# Patient Record
Sex: Female | Born: 1995 | Hispanic: Yes | Marital: Married | Smoking: Current some day smoker
Health system: Southern US, Community
[De-identification: ages and names within clinical notes are randomized; demographics above are authoritative.]

## PROBLEM LIST (undated history)

## (undated) DIAGNOSIS — F419 Anxiety disorder, unspecified: Secondary | ICD-10-CM

## (undated) DIAGNOSIS — F32A Depression, unspecified: Secondary | ICD-10-CM

## (undated) DIAGNOSIS — M549 Dorsalgia, unspecified: Secondary | ICD-10-CM

## (undated) DIAGNOSIS — M419 Scoliosis, unspecified: Secondary | ICD-10-CM

## (undated) DIAGNOSIS — F329 Major depressive disorder, single episode, unspecified: Secondary | ICD-10-CM

## (undated) DIAGNOSIS — Z9889 Other specified postprocedural states: Secondary | ICD-10-CM

## (undated) DIAGNOSIS — E282 Polycystic ovarian syndrome: Secondary | ICD-10-CM

## (undated) DIAGNOSIS — R112 Nausea with vomiting, unspecified: Secondary | ICD-10-CM

## (undated) DIAGNOSIS — O24419 Gestational diabetes mellitus in pregnancy, unspecified control: Secondary | ICD-10-CM

## (undated) HISTORY — DX: Gestational diabetes mellitus in pregnancy, unspecified control: O24.419

## (undated) HISTORY — DX: Nausea with vomiting, unspecified: R11.2

## (undated) HISTORY — DX: Other specified postprocedural states: Z98.890

## (undated) HISTORY — PX: BREAST CYST EXCISION: SHX579

## (undated) HISTORY — PX: WISDOM TOOTH EXTRACTION: SHX21

---
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
## 2016-01-21 ENCOUNTER — Encounter (HOSPITAL_COMMUNITY): Payer: Self-pay | Admitting: Emergency Medicine

## 2016-01-21 ENCOUNTER — Emergency Department (HOSPITAL_COMMUNITY): Payer: Medicaid Other

## 2016-01-21 ENCOUNTER — Emergency Department (HOSPITAL_COMMUNITY)
Admission: EM | Admit: 2016-01-21 | Discharge: 2016-01-21 | Disposition: A | Discharge status: Home / Self Care | Payer: Medicaid Other | Attending: Emergency Medicine | Admitting: Emergency Medicine

## 2016-01-21 DIAGNOSIS — M419 Scoliosis, unspecified: Secondary | ICD-10-CM | POA: Diagnosis not present

## 2016-01-21 DIAGNOSIS — Z79899 Other long term (current) drug therapy: Secondary | ICD-10-CM | POA: Diagnosis not present

## 2016-01-21 DIAGNOSIS — M545 Low back pain: Secondary | ICD-10-CM | POA: Diagnosis present

## 2016-01-21 DIAGNOSIS — F329 Major depressive disorder, single episode, unspecified: Secondary | ICD-10-CM | POA: Diagnosis not present

## 2016-01-21 DIAGNOSIS — M549 Dorsalgia, unspecified: Secondary | ICD-10-CM

## 2016-01-21 HISTORY — DX: Scoliosis, unspecified: M41.9

## 2016-01-21 LAB — POC URINE PREG, ED: PREG TEST UR: NEGATIVE

## 2016-01-21 MED ORDER — NAPROXEN 500 MG PO TABS: 500.0000 mg | ORAL_TABLET | Freq: Two times a day (BID) | ORAL | Status: DC

## 2016-01-21 MED ORDER — METHOCARBAMOL 500 MG PO TABS: 500.0000 mg | ORAL_TABLET | Freq: Two times a day (BID) | ORAL | Status: DC

## 2016-01-21 MED ORDER — NAPROXEN 500 MG PO TABS
500.0000 mg | ORAL_TABLET | Freq: Once | ORAL | Status: AC
  Administered 2016-01-21: 500 mg via ORAL
  Filled 2016-01-21: qty 1

## 2016-01-21 MED ORDER — METHOCARBAMOL 500 MG PO TABS
500.0000 mg | ORAL_TABLET | Freq: Once | ORAL | Status: AC
  Administered 2016-01-21: 500 mg via ORAL
  Filled 2016-01-21: qty 1

## 2016-01-21 NOTE — ED Provider Notes (Signed)
CSN: [id number]     Arrival date & time 01/21/16  1643 History  By signing my name below, I, Octavia Heir, attest that this documentation has been prepared under the direction and in the presence of Sharilyn Sites, PA-C. Electronically Signed: Octavia Heir, ED Scribe. 01/21/2016. 5:33 PM.     Chief Complaint  Patient presents with  . Back Pain     The history is provided by the patient. No language interpreter was used.   HPI Comments: Candice Farrell is a 20 y.o. female who has a PMHx of scoliosis presents to the Emergency Department complaining of constant, sudden onset, gradual worsening, moderate non-radiating lower back pain onset today. Pt says she was sitting in class and started having extreme pain in her lower back pain. Pt is able to ambulate minimally due to increased back pain. She says she had a similar episode occur last year and was given muscle relaxers to alleviate her pain with minimal relief. She denies any trauma or injury to her back.  No numbness, paresthesias or weakness of extremities.  No loss of bowel or bladder control.  States she is followed by back specialist for her scoliosis, however has not seem him in over 1 year.  No hx of back surgeries.  VSS.  Past Medical History  Diagnosis Date  . Anxiety   . Depression    Past Surgical History  Procedure Laterality Date  . Breast cyst excision     No family history on file. Social History  Substance Use Topics  . Smoking status: Never Smoker   . Smokeless tobacco: Not on file  . Alcohol Use: No   OB History    No data available     Review of Systems  Musculoskeletal: Positive for back pain.  All other systems reviewed and are negative.     Allergies  Review of patient's allergies indicates no known allergies.  Home Medications   Prior to Admission medications   Medication Sig Start Date End Date Taking? Authorizing Provider  buPROPion (WELLBUTRIN XL) 300 MG 24 hr tablet Take 1 tablet (300 mg  total) by mouth daily. 04/01/14   Kendrick Fries, NP   Triage vitals: BP 135/78 mmHg  Pulse 85  Temp(Src) 98.2 F (36.8 C) (Oral)  Resp 18  SpO2 99%  LMP 01/14/2016 Physical Exam  Constitutional: She is oriented to person, place, and time. She appears well-developed and well-nourished.  HENT:  Head: Normocephalic and atraumatic.  Mouth/Throat: Oropharynx is clear and moist.  Eyes: Conjunctivae and EOM are normal. Pupils are equal, round, and reactive to light.  Neck: Normal range of motion.  Cardiovascular: Normal rate, regular rhythm and normal heart sounds.   Pulmonary/Chest: Effort normal and breath sounds normal.  Abdominal: Soft. Bowel sounds are normal.  Musculoskeletal: Normal range of motion.  Lumbar spine with midline tenderness, mild chronic lordosis noted, decreased ROM due to pain, normal strength/sensation of BLE, ambulatory with slow gait  Neurological: She is alert and oriented to person, place, and time.  Skin: Skin is warm and dry.  Psychiatric: She has a normal mood and affect.  Nursing note and vitals reviewed.   ED Course  Procedures  DIAGNOSTIC STUDIES: Oxygen Saturation is 99% on RA, normal by my interpretation.  COORDINATION OF CARE:  5:29 PM Will order x-ray of back. Discussed treatment plan which includes pain medication with pt at bedside and pt agreed to plan.  Labs Review Labs Reviewed  POC URINE PREG, ED    Imaging  Review Dg Lumbar Spine Complete  01/21/2016  CLINICAL DATA:  Sudden onset of back pain while sitting, history of scoliosis, no known injury EXAM: LUMBAR SPINE - COMPLETE 4+ VIEW COMPARISON:  None. FINDINGS: Five views of the lumbar spine submitted. No acute fracture or subluxation. Alignment, disc spaces and vertebral body heights are preserved. IMPRESSION: Negative. Electronically Signed   By: Natasha MeadLiviu  Pop M.D.   On: 01/21/2016 18:36   I have personally reviewed and evaluated these images and lab results as part of my medical  decision-making.   EKG Interpretation None      MDM   Final diagnoses:  Back pain, unspecified location   20 y.o. F here with sudden onset, atraumatic pain this afternoon while sitting in class.  Hx of scoliosis, similar pain in the past.  Patient has tenderness of her midline lumbar spine noted on exam. She appears to have a chronic mild lordosis. Normal strength and sensation of bilateral lower extremities, gait is slow but otherwise normal. No deficits to suggest cauda equina.  Denies any current urinary symptoms, fever, chills, or flank pain. X-ray of lumbar spine obtained, no acute findings. Patient was treated with Naprosyn and Robaxin in the ED with improvement of her pain. Will discharge home with the same.  Encouraged to follow-up with her back specialist if no improvement in the next week.  Discussed plan with patient, he/she acknowledged understanding and agreed with plan of care.  Return precautions given for new or worsening symptoms.  I personally performed the services described in this documentation, which was scribed in my presence. The recorded information has been reviewed and is accurate.  Garlon HatchetLisa M Akim Watkinson, PA-C 01/21/16 1923  Donnetta HutchingBrian Cook, MD 01/21/16 2250

## 2016-01-21 NOTE — Discharge Instructions (Signed)
Take the prescribed medication as directed. Follow-up with your back specialist if no improvement in the next week or so. Return to the ED for new or worsening symptoms.

## 2016-01-21 NOTE — ED Notes (Signed)
Pt was sitting in class this afternoon when she had a sudden onset of lower back pain. Same thing happened a year ago and was given rx for muscle relaxers. Hx of scoliosis.

## 2016-05-22 ENCOUNTER — Encounter: Payer: Self-pay | Admitting: *Deleted

## 2016-05-22 LAB — PROCEDURE REPORT - SCANNED: PAP SMEAR: NEGATIVE

## 2016-07-04 ENCOUNTER — Encounter (HOSPITAL_COMMUNITY): Payer: Self-pay | Admitting: Emergency Medicine

## 2016-07-04 DIAGNOSIS — G8929 Other chronic pain: Secondary | ICD-10-CM | POA: Diagnosis not present

## 2016-07-04 DIAGNOSIS — M545 Low back pain: Secondary | ICD-10-CM | POA: Insufficient documentation

## 2016-07-04 DIAGNOSIS — M25511 Pain in right shoulder: Secondary | ICD-10-CM | POA: Diagnosis not present

## 2016-07-04 NOTE — ED Triage Notes (Signed)
C/o R shoulder pain and lower back pain x 2-3 days that has been worse since last night.  Reports she injured them at work about a month ago and was told not to lift anything over 15 lbs.  She had been doing better and pain started back 2-3 days ago.

## 2016-07-05 ENCOUNTER — Emergency Department (HOSPITAL_COMMUNITY)
Admission: EM | Admit: 2016-07-05 | Discharge: 2016-07-05 | Disposition: A | Discharge status: Home / Self Care | Payer: Medicaid Other | Attending: Emergency Medicine | Admitting: Emergency Medicine

## 2016-07-05 DIAGNOSIS — M25511 Pain in right shoulder: Secondary | ICD-10-CM

## 2016-07-05 DIAGNOSIS — G8929 Other chronic pain: Secondary | ICD-10-CM

## 2016-07-05 DIAGNOSIS — M545 Low back pain: Secondary | ICD-10-CM

## 2016-07-05 HISTORY — DX: Dorsalgia, unspecified: M54.9

## 2016-07-05 MED ORDER — METHOCARBAMOL 500 MG PO TABS: 500.0000 mg | ORAL_TABLET | Freq: Every day | ORAL | 0 refills | Status: DC

## 2016-07-05 MED ORDER — NAPROXEN 500 MG PO TABS: 500.0000 mg | ORAL_TABLET | Freq: Two times a day (BID) | ORAL | 0 refills | Status: DC

## 2016-07-05 NOTE — ED Provider Notes (Signed)
[facility]-EMERGENCY DEPT Provider Note   CSN: [id number] Arrival date & time: 07/04/16  2323     History   Chief Complaint Chief Complaint  Patient presents with  . Back Pain  . Shoulder Pain    HPI Candice Farrell is a 20 y.o. female.  HPI   Patient is a 20 year old female with a history of chronic lower back pain, depression, scoliosis, who presents the emergency department with progressively worsening right rear shoulder pain for 3 days. Patient also states her chronic back pain is worsened over the last 2 days. Patient states 3 weeks ago work she injured her right shoulder. She states the pain she is having now is similar to the pain 3 weeks ago only worse. She says her pain is at 7/10 at rest, worse with movement, 9/10, nonradiating. 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 Patient has taken Robaxin and naproxen with little relief. Patient denies numbness/timing, weakness, fevers, urinary retention, loss of bowel bladder function, fevers.  Past Medical History:  Diagnosis Date  . Anxiety   . Back pain   . Depression   . Scoliosis     Patient Active Problem List   Diagnosis Date Noted  . MDD (major depressive disorder), recurrent episode, moderate (HCC) 03/25/2014  . ODD (oppositional defiant disorder) 03/25/2014  . Post traumatic stress disorder (PTSD) 03/25/2014  . Cannabis abuse 03/25/2014    Past Surgical History:  Procedure Laterality Date  . BREAST CYST EXCISION      OB History    No data available       Home Medications    Prior to Admission medications   Medication Sig Start Date End Date Taking? Authorizing Provider  buPROPion (WELLBUTRIN XL) 300 MG 24 hr tablet Take 1 tablet (300 mg total) by mouth daily. 04/01/14   Kendrick FriesMeghan Blankmann, NP  methocarbamol (ROBAXIN) 500 MG tablet Take 1 tablet (500 mg total) by mouth at bedtime. 07/05/16   Jerre SimonJessica L Ellieana Dolecki, PA  naproxen (NAPROSYN) 500 MG tablet Take 1 tablet (500 mg total) by mouth 2 (two) times daily with a  meal. 07/05/16   Jerre SimonJessica L Ethridge Sollenberger, PA    Family History No family history on file.  Social History Social History  Substance Use Topics  . Smoking status: Never Smoker  . Smokeless tobacco: Never Used  . Alcohol use No     Allergies   Review of patient's allergies indicates no known allergies.   Review of Systems Review of Systems  Constitutional: Negative for fever.  Gastrointestinal: Negative for nausea and vomiting.  Musculoskeletal: Positive for arthralgias and back pain. Negative for joint swelling, neck pain and neck stiffness.  Skin: Negative for rash and wound.  Neurological: Negative for weakness and numbness.     Physical Exam Updated Vital Signs BP 137/73 (BP Location: Left Arm)   Pulse 65   Temp 97.8 F (36.6 C) (Oral)   Resp 18   LMP 06/27/2016 (Exact Date)   SpO2 100%   Physical Exam  Constitutional: She appears well-developed and well-nourished. No distress.  HENT:  Head: Normocephalic and atraumatic.  Eyes: Conjunctivae are normal.  Neck: Normal range of motion. Neck supple.  Pulmonary/Chest: Effort normal. No respiratory distress.  Musculoskeletal: Normal range of motion.  Patient with full range of motion, sensation intact, strength 5/5 of bilateral upper extremities, TTP 2 supraspinatus, infraspinatus and trapezius muscles on the right side. Pain reproduced with resisted abduction. Pain also reproduced with liftoff test. 2+ radial pulses bilaterally. Patient with  mild TTP to spinous processes of the L-spine roughly L3-L4. Mild bilateral paraspinal muscle tenderness. Good range of motion of T and L spine. Normal gait  Neurological: She is alert. She has normal strength. No sensory deficit. Coordination normal.  Skin: Skin is warm and dry. She is not diaphoretic.  Psychiatric: She has a normal mood and affect. Her behavior is normal.  Nursing note and vitals reviewed.    ED Treatments / Results  Labs (all labs ordered are listed, but only  abnormal results are displayed) Labs Reviewed - No data to display  EKG  EKG Interpretation None       Radiology No results found.  Procedures Procedures (including critical care time)  Medications Ordered in ED Medications - No data to display   Initial Impression / Assessment and Plan / ED Course  I have reviewed the triage vital signs and the nursing notes.  Pertinent labs & imaging results that were available during my care of the patient were reviewed by me and considered in my medical decision making (see chart for details).  Clinical Course   Patient with unchanged chronic lower back pain. Patient with right shoulder pain from previous injury at job.   Exam findings are nonconcerning for any bony pathologies and suspect this is MSK in nature. I do not believe any imaging is indicated at this time due to pt's full ROM involving the shoulder, elbow, and wrist. Her presentation is concerning for rotator cuff muscle pathology.  Conservative therapies recommended at home including ice, pain medication, muscle relaxants. Patient is neurovascular intact distally. IInstructed patient to follow up with her primary care provider regarding her chronic back pain and shoulder pain. Discussed strict return precautions the ED. Patient afebrile, VSS, stable at time of discharge. Patient expressed understanding to the discharge instructions. All questions were answered prior to disposition.  Final Clinical Impressions(s) / ED Diagnoses   Final diagnoses:  Right shoulder pain, unspecified chronicity  Chronic midline low back pain without sciatica    New Prescriptions Current Discharge Medication List       Jerre Simon, [place] 07/06/16 0021    Dione Booze, MD 07/06/16 [id number]

## 2016-07-05 NOTE — Discharge Instructions (Signed)
Ice your shoulder and your lower back as often as you can, 20 minutes on, 20 minutes off. Take the naproxen and Flexeril as prescribed. Follow-up with your primary care physician this week regarding your back pain and shoulder pain. Return to the emergency department if you experience numbness/tingling or weakness in your right hand, loss of bowel bladder function, inability to urinate, pain radiating into your buttocks or legs or any other concerning symptoms.

## 2016-07-08 ENCOUNTER — Encounter (HOSPITAL_COMMUNITY): Payer: Self-pay

## 2016-07-08 ENCOUNTER — Emergency Department (HOSPITAL_COMMUNITY)
Admission: EM | Admit: 2016-07-08 | Discharge: 2016-07-08 | Disposition: A | Discharge status: Home / Self Care | Payer: Medicaid Other | Attending: Emergency Medicine | Admitting: Emergency Medicine

## 2016-07-08 DIAGNOSIS — N926 Irregular menstruation, unspecified: Secondary | ICD-10-CM | POA: Diagnosis not present

## 2016-07-08 DIAGNOSIS — N898 Other specified noninflammatory disorders of vagina: Secondary | ICD-10-CM | POA: Diagnosis present

## 2016-07-08 LAB — I-STAT BETA HCG BLOOD, ED (MC, WL, AP ONLY): I-stat hCG, quantitative: <5 m[IU]/mL (ref <5)

## 2016-07-08 LAB — POC URINE PREG, ED: Preg Test, Ur: NEGATIVE

## 2016-07-08 NOTE — Discharge Instructions (Signed)
Pregnancy test is negative. Please follow up with your doctor as needed.

## 2016-07-08 NOTE — ED Triage Notes (Signed)
Patient here requesting serum pregnancy test. Did urine preg at home and states line was a faint color. denis pain, no complaints.

## 2016-07-08 NOTE — ED Provider Notes (Signed)
[facility]-EMERGENCY DEPT Provider Note   CSN: [id number] Arrival date & time: 07/08/16  1435   By signing my name below, I, Freida Busman, attest that this documentation has been prepared under the direction and in the presence of non-physician practitioner, Jaynie Crumble, PA-C. Electronically Signed: Freida Busman, Scribe. 07/08/2016. 2:58 PM.   History   Chief Complaint Chief Complaint  Patient presents with  . Possible Pregnancy    The history is provided by the patient. No language interpreter was used.     HPI Comments:  Candice Farrell is a 20 y.o. female who presents to the Emergency Department to have a pregnancy test done. She states she took a pregnancy test at home and saw 2 lines but notes the second line was faint. Her LNMP was September 2017. She notes she stopped taking BCP ~5 months ago due to fibroid tumor. She states she has had an episode of brown vaginal discharge and mild spotting but states she has not had a heavy period like she normal has this month.  No abdominal pain or urinary symptoms.    Past Medical History:  Diagnosis Date  . Anxiety   . Back pain   . Depression   . Scoliosis     Patient Active Problem List   Diagnosis Date Noted  . MDD (major depressive disorder), recurrent episode, moderate (HCC) 03/25/2014  . ODD (oppositional defiant disorder) 03/25/2014  . Post traumatic stress disorder (PTSD) 03/25/2014  . Cannabis abuse 03/25/2014    Past Surgical History:  Procedure Laterality Date  . BREAST CYST EXCISION      OB History    No data available       Home Medications    Prior to Admission medications   Medication Sig Start Date End Date Taking? Authorizing Provider  buPROPion (WELLBUTRIN XL) 300 MG 24 hr tablet Take 1 tablet (300 mg total) by mouth daily. 04/01/14   Kendrick Fries, NP  methocarbamol (ROBAXIN) 500 MG tablet Take 1 tablet (500 mg total) by mouth at bedtime. 07/05/16   Jerre Simon, PA  naproxen (NAPROSYN)  500 MG tablet Take 1 tablet (500 mg total) by mouth 2 (two) times daily with a meal. 07/05/16   Jerre Simon, PA    Family History No family history on file.  Social History Social History  Substance Use Topics  . Smoking status: Never Smoker  . Smokeless tobacco: Never Used  . Alcohol use No     Allergies   Review of patient's allergies indicates no known allergies.   Review of Systems Review of Systems  Gastrointestinal: Negative for abdominal pain.  Genitourinary: Positive for vaginal discharge (resolved). Negative for dysuria, frequency, hematuria, urgency and vaginal bleeding.     Physical Exam Updated Vital Signs BP 143/87 (BP Location: Right Arm)   Pulse 77   Temp 98.1 F (36.7 C) (Oral)   Resp 16   Ht 4\' 11"  (1.499 m)   Wt 180 lb (81.6 kg)   LMP 05/28/2016   SpO2 100%   BMI 36.36 kg/m   Physical Exam  Constitutional: She is oriented to person, place, and time. She appears well-developed and well-nourished. No distress.  HENT:  Head: Normocephalic and atraumatic.  Eyes: Conjunctivae are normal.  Cardiovascular: Normal rate.   Pulmonary/Chest: Effort normal.  Abdominal: She exhibits no distension.  Neurological: She is alert and oriented to person, place, and time.  Skin: Skin is warm and dry.  Psychiatric: She has a normal mood and affect.  Nursing note and vitals reviewed.    ED Treatments / Results  DIAGNOSTIC STUDIES:  Oxygen Saturation is 100% on RA, normal by my interpretation.    COORDINATION OF CARE:  2:51 PM Discussed treatment plan with pt at bedside and pt agreed to plan.  Labs (all labs ordered are listed, but only abnormal results are displayed) Labs Reviewed  POC URINE PREG, ED    EKG  EKG Interpretation None       Radiology No results found.  Procedures Procedures (including critical care time)  Medications Ordered in ED Medications - No data to display   Initial Impression / Assessment and Plan / ED Course   I have reviewed the triage vital signs and the nursing notes.  Pertinent labs & imaging results that were available during my care of the patient were reviewed by me and considered in my medical decision making (see chart for details).  Clinical Course    Patient emergency department for pregnancy test only. She has no complaints. She had a positive test at home. Urine pregnancy obtained and was negative. We did run blood hCG which also came back negative. Discuss results with patient, will have patient follow-up with her doctor as needed.  Vitals:   07/08/16 1440  BP: 143/87  Pulse: 77  Resp: 16  Temp: 98.1 F (36.7 C)  TempSrc: Oral  SpO2: 100%  Weight: 81.6 kg  Height: 4\' 11"  (1.499 m)     Final Clinical Impressions(s) / ED Diagnoses   Final diagnoses:  Irregular periods    New Prescriptions New Prescriptions   No medications on file   I personally performed the services described in this documentation, which was scribed in my presence. The recorded information has been reviewed and is accurate.     Jaynie Crumbleatyana Fransico Sciandra, PA-C 07/08/16 1615    Alvira MondayErin Schlossman, MD 07/08/16 2351

## 2016-07-08 NOTE — ED Notes (Signed)
Papers reviewed and patient verbalizes understanding. Excited with the results

## 2017-08-28 ENCOUNTER — Encounter (HOSPITAL_COMMUNITY): Payer: Self-pay | Admitting: *Deleted

## 2017-08-28 ENCOUNTER — Other Ambulatory Visit: Payer: Self-pay

## 2017-08-28 ENCOUNTER — Emergency Department (HOSPITAL_COMMUNITY)
Admission: EM | Admit: 2017-08-28 | Discharge: 2017-08-28 | Disposition: A | Discharge status: Home / Self Care | Payer: Self-pay | Attending: Emergency Medicine | Admitting: Emergency Medicine

## 2017-08-28 DIAGNOSIS — Z79899 Other long term (current) drug therapy: Secondary | ICD-10-CM | POA: Insufficient documentation

## 2017-08-28 DIAGNOSIS — N939 Abnormal uterine and vaginal bleeding, unspecified: Secondary | ICD-10-CM | POA: Insufficient documentation

## 2017-08-28 DIAGNOSIS — R102 Pelvic and perineal pain: Secondary | ICD-10-CM

## 2017-08-28 DIAGNOSIS — N852 Hypertrophy of uterus: Secondary | ICD-10-CM | POA: Insufficient documentation

## 2017-08-28 LAB — COMPREHENSIVE METABOLIC PANEL
ALT: 28 U/L (ref 14–54)
AST: 25 U/L (ref 15–41)
Albumin: 4.7 g/dL (ref 3.5–5.0)
Alkaline Phosphatase: 62 U/L (ref 38–126)
Anion gap: 9 (ref 5–15)
BUN: 7 mg/dL (ref 6–20)
CHLORIDE: 103 mmol/L (ref 101–111)
CO2: 23 mmol/L (ref 22–32)
Calcium: 9.5 mg/dL (ref 8.9–10.3)
Creatinine, Ser: 0.7 mg/dL (ref 0.44–1.00)
GFR calc Af Amer: >60 mL/min (ref >60)
Glucose, Bld: 79 mg/dL (ref 65–99)
Potassium: 3.8 mmol/L (ref 3.5–5.1)
Sodium: 135 mmol/L (ref 135–145)
Total Bilirubin: 0.6 mg/dL (ref 0.3–1.2)
Total Protein: 7.6 g/dL (ref 6.5–8.1)

## 2017-08-28 LAB — CBC
HEMATOCRIT: 38.8 % (ref 36.0–46.0)
Hemoglobin: 12.8 g/dL (ref 12.0–15.0)
MCH: 31.4 pg (ref 26.0–34.0)
MCHC: 33 g/dL (ref 30.0–36.0)
MCV: 95.3 fL (ref 78.0–100.0)
Platelets: 210 10*3/uL (ref 150–400)
RBC: 4.07 MIL/uL (ref 3.87–5.11)
RDW: 13.4 % (ref 11.5–15.5)
WBC: 8.9 10*3/uL (ref 4.0–10.5)

## 2017-08-28 LAB — I-STAT BETA HCG BLOOD, ED (MC, WL, AP ONLY): I-stat hCG, quantitative: <5 m[IU]/mL (ref <5)

## 2017-08-28 LAB — LIPASE, BLOOD: Lipase: 20 U/L (ref 11–51)

## 2017-08-28 MED ORDER — STERILE WATER FOR INJECTION IJ SOLN
INTRAMUSCULAR | Status: AC
  Administered 2017-08-28: 2.1 mL
  Filled 2017-08-28: qty 10

## 2017-08-28 MED ORDER — AZITHROMYCIN 250 MG PO TABS
1000.0000 mg | ORAL_TABLET | Freq: Once | ORAL | Status: AC
  Administered 2017-08-28: 1000 mg via ORAL
  Filled 2017-08-28: qty 4

## 2017-08-28 MED ORDER — CEFTRIAXONE SODIUM 250 MG IJ SOLR
250.0000 mg | Freq: Once | INTRAMUSCULAR | Status: AC
  Administered 2017-08-28: 250 mg via INTRAMUSCULAR
  Filled 2017-08-28: qty 250

## 2017-08-28 MED ORDER — ESTRADIOL VALERATE-DIENOGEST 3/2-2/2-3/1 MG PO TABS: 1.0000 | ORAL_TABLET | Freq: Every day | ORAL | 11 refills | Status: DC

## 2017-08-28 NOTE — ED Triage Notes (Signed)
abd pain and vaginal bleeding for 2 weeks  lmp 3 weeks  Ago  She rep[orts that she has had clots with heavy bleeding

## 2017-08-28 NOTE — ED Provider Notes (Signed)
[facility]   CSN: [id number] Arrival date & time: 08/28/17  1659     History   Chief Complaint Chief Complaint  Patient presents with  . Abdominal Pain    HPI Candice Farrell is a 21 y.o. female.  HPI  21 year old G0P0 who presents today complaining of several weeks of suprapubic pain.  She has associated vaginal bleeding that has been using up to 6 pads per day.  She states that when she was younger she had irregular periods and was on birth control pills for several years.  She has been off of birth control pills for the past year.  She states she had normal periods for the first 6 months and then they became irregular.  She has not had some vaginal bleeding for the past 2 weeks.  States she was being worked up previously for fibroid tumors.  She has not had intercourse during this time.  Prior to the pain she was not having any abnormal vaginal discharge or pain with intercourse.  She denies any urinary tract infection symptoms.  She denies being lightheaded.  Past Medical History:  Diagnosis Date  . Anxiety   . Back pain   . Depression   . Scoliosis     Patient Active Problem List   Diagnosis Date Noted  . MDD (major depressive disorder), recurrent episode, moderate (HCC) 03/25/2014  . ODD (oppositional defiant disorder) 03/25/2014  . Post traumatic stress disorder (PTSD) 03/25/2014  . Cannabis abuse 03/25/2014    Past Surgical History:  Procedure Laterality Date  . BREAST CYST EXCISION      OB History    No data available       Home Medications    Prior to Admission medications   Medication Sig Start Date End Date Taking? Authorizing Provider  buPROPion (WELLBUTRIN XL) 300 MG 24 hr tablet Take 1 tablet (300 mg total) by mouth daily. 04/01/14   Kendrick FriesBlankmann, Meghan, NP  methocarbamol (ROBAXIN) 500 MG tablet Take 1 tablet (500 mg total) by mouth at bedtime. 07/05/16   Focht, Joyce CopaJessica L, PA  naproxen (NAPROSYN)  500 MG tablet Take 1 tablet (500 mg total) by mouth 2 (two) times daily with a meal. 07/05/16   Focht, Joyce CopaJessica L, PA    Family History No family history on file.  Social History Social History   Tobacco Use  . Smoking status: Never Smoker  . Smokeless tobacco: Never Used  Substance Use Topics  . Alcohol use: No  . Drug use: No     Allergies   Patient has no known allergies.   Review of Systems Review of Systems  All other systems reviewed and are negative.    Physical Exam Updated Vital Signs BP 132/78   Pulse 63   Temp 98.3 F (36.8 C)   Resp 18   Ht 1.499 m (4\' 11" )   Wt 72.6 kg (160 lb)   LMP 07/29/2017   SpO2 100%   BMI 32.32 kg/m   Physical Exam  Constitutional: She appears well-developed and well-nourished.  HENT:  Head: Normocephalic and atraumatic.  Mouth/Throat: Oropharynx is clear and moist.  Eyes: Pupils are equal, round, and reactive to light.  Abdominal: Soft. Normal appearance and bowel sounds are normal. There is tenderness in the suprapubic area.  Genitourinary: Uterus is enlarged. Cervix exhibits no motion tenderness. Right adnexum displays no mass and no tenderness. Left adnexum displays no mass and no tenderness. There is bleeding in the vagina.  Skin: Skin is warm. Capillary refill takes less than 2 seconds.  Nursing note and vitals reviewed.    ED Treatments / Results  Labs (all labs ordered are listed, but only abnormal results are displayed) Labs Reviewed  LIPASE, BLOOD  COMPREHENSIVE METABOLIC PANEL  CBC  I-STAT BETA HCG BLOOD, ED ([facility], [facility], [facility] ONLY)    EKG  EKG Interpretation None       Radiology No results found.  Procedures Procedures (including critical care time)  Medications Ordered in ED Medications - No data to display   Initial Impression / Assessment and Plan / ED Course  I have reviewed the triage vital signs and the nursing notes.  Pertinent labs & imaging results that were available during my care  of the patient were reviewed by me and considered in my medical decision making (see chart for details).     21 year old female presents today with pelvic pain and abnormal vaginal bleeding.  She is hemodynamically stable and hemoglobin is normal.  On bimanual exam her uterus is asymmetrically enlarged.  She has had cervical swab sent for gonorrhea and chlamydia.  She is treated here with Rocephin and Zithromax.  She is sent to gynecology for follow-up.  She is placed on birth control pills for the abnormal vaginal bleeding. Final Clinical Impressions(s) / ED Diagnoses   Final diagnoses:  Abnormal uterine bleeding  Pelvic pain in female    ED Discharge Orders    None       Margarita Grizzleay, Dynver Clemson, MD 08/28/17 1946

## 2017-08-29 LAB — GC/CHLAMYDIA PROBE AMP (~~LOC~~) NOT AT ARMC
CHLAMYDIA, DNA PROBE: NEGATIVE
NEISSERIA GONORRHEA: NEGATIVE

## 2017-09-05 ENCOUNTER — Encounter (HOSPITAL_COMMUNITY): Payer: Self-pay | Admitting: Neurology

## 2017-09-05 ENCOUNTER — Emergency Department (HOSPITAL_COMMUNITY)
Admission: EM | Admit: 2017-09-05 | Discharge: 2017-09-05 | Disposition: A | Discharge status: Home / Self Care | Payer: Self-pay | Attending: Emergency Medicine | Admitting: Emergency Medicine

## 2017-09-05 DIAGNOSIS — N938 Other specified abnormal uterine and vaginal bleeding: Secondary | ICD-10-CM | POA: Insufficient documentation

## 2017-09-05 DIAGNOSIS — R42 Dizziness and giddiness: Secondary | ICD-10-CM | POA: Insufficient documentation

## 2017-09-05 LAB — I-STAT BETA HCG BLOOD, ED (MC, WL, AP ONLY): I-stat hCG, quantitative: <5 m[IU]/mL (ref <5)

## 2017-09-05 LAB — CBC
HCT: 38.2 % (ref 36.0–46.0)
Hemoglobin: 12.9 g/dL (ref 12.0–15.0)
MCH: 31.8 pg (ref 26.0–34.0)
MCHC: 33.8 g/dL (ref 30.0–36.0)
MCV: 94.1 fL (ref 78.0–100.0)
PLATELETS: 224 10*3/uL (ref 150–400)
RBC: 4.06 MIL/uL (ref 3.87–5.11)
RDW: 13 % (ref 11.5–15.5)
WBC: 7.9 10*3/uL (ref 4.0–10.5)

## 2017-09-05 MED ORDER — ESTROGENS CONJUGATED 1.25 MG PO TABS: 2.5000 mg | ORAL_TABLET | Freq: Every day | ORAL | 0 refills | Status: DC

## 2017-09-05 MED ORDER — SODIUM CHLORIDE 0.9 % IV BOLUS (SEPSIS)
1000.0000 mL | Freq: Once | INTRAVENOUS | Status: AC
  Administered 2017-09-05: 1000 mL via INTRAVENOUS

## 2017-09-05 MED ORDER — ESTROGENS CONJUGATED 1.25 MG PO TABS
5.0000 mg | ORAL_TABLET | Freq: Every day | ORAL | Status: DC
  Administered 2017-09-05: 5 mg via ORAL
  Filled 2017-09-05: qty 4

## 2017-09-05 MED ORDER — FERROUS SULFATE 325 (65 FE) MG PO TABS: 650.0000 mg | ORAL_TABLET | Freq: Every day | ORAL | 0 refills | Status: DC

## 2017-09-05 NOTE — ED Triage Notes (Signed)
Pt reports heavy vaginal bleeding x 1 month, with clots. Was told last Sunday her uterus was enlarged, told to f/u in women's clinic, but wasn't able, due to lack of insurance. Has been feeling lightheaded since last night.

## 2017-09-05 NOTE — Discharge Instructions (Signed)
Premarin prescription 5 days. Temporarily improved and stop her bleeding. Iron prescription as above. GYN follow[facility] with physician [facility] choice[facility] [place]

## 2017-09-05 NOTE — ED Notes (Signed)
Declined W/C at D/C and was escorted to lobby by RN. 

## 2017-09-05 NOTE — ED Provider Notes (Signed)
[facility]   CSN: [id number] Arrival date & time: 09/05/17  [id number]     History   Chief Complaint Chief Complaint  Patient presents with  . Vaginal Bleeding    HPI Candice Farrell is a 21 y.o. female. Chief complaint is vaginal bleeding.  HPI chief complaint is ongoing vaginal bleeding. Patient seen and evaluated here 9 days ago. Prescribed birth control pills. She is not taking them. Was asked to follow-up in GYN she has not. Continues to have heavy bleeding daily multiple pads. Felt lightheaded last night did not frankly syncope. No abdominal pain.  Past Medical History:  Diagnosis Date  . Anxiety   . Back pain   . Depression   . Scoliosis     Patient Active Problem List   Diagnosis Date Noted  . MDD (major depressive disorder), recurrent episode, moderate (HCC) 03/25/2014  . ODD (oppositional defiant disorder) 03/25/2014  . Post traumatic stress disorder (PTSD) 03/25/2014  . Cannabis abuse 03/25/2014    Past Surgical History:  Procedure Laterality Date  . BREAST CYST EXCISION      OB History    No data available       Home Medications    Prior to Admission medications   Medication Sig Start Date End Date Taking? Authorizing Provider  buPROPion (WELLBUTRIN XL) 300 MG 24 hr tablet Take 1 tablet (300 mg total) by mouth daily. Patient not taking: Reported on 08/28/2017 04/01/14   Kendrick FriesBlankmann, Meghan, NP  Estradiol Valerate-Dienogest (NATAZIA) 3/2-2/2-3/1 MG tablet Take 1 tablet by mouth daily. Patient not taking: Reported on 09/05/2017 08/28/17   Margarita Grizzleay, Danielle, MD  estrogens, conjugated, (PREMARIN) 1.25 MG tablet Take 2 tablets (2.5 mg total) by mouth daily. 09/05/17   Rolland PorterJames, Maxamus Colao, MD  ferrous sulfate 325 (65 FE) MG tablet Take 2 tablets (650 mg total) by mouth daily. 09/05/17   Rolland PorterJames, Jaskarn Schweer, MD    Family History No family history on file.  Social History Social History   Tobacco Use  . Smoking status:  Never Smoker  . Smokeless tobacco: Never Used  Substance Use Topics  . Alcohol use: Yes  . Drug use: No     Allergies   Patient has no known allergies.   Review of Systems Review of Systems  Constitutional: Negative for appetite change, chills, diaphoresis, fatigue and fever.  HENT: Negative for mouth sores, sore throat and trouble swallowing.   Eyes: Negative for visual disturbance.  Respiratory: Negative for cough, chest tightness, shortness of breath and wheezing.   Cardiovascular: Negative for chest pain.  Gastrointestinal: Negative for abdominal distention, abdominal pain, diarrhea, nausea and vomiting.  Endocrine: Negative for polydipsia, polyphagia and polyuria.  Genitourinary: Positive for menstrual problem and vaginal bleeding. Negative for dysuria, frequency and hematuria.  Musculoskeletal: Negative for gait problem.  Skin: Negative for color change, pallor and rash.  Neurological: Negative for dizziness, syncope, light-headedness and headaches.  Hematological: Does not bruise/bleed easily.  Psychiatric/Behavioral: Negative for behavioral problems and confusion.     Physical Exam Updated Vital Signs BP 129/85 (BP Location: Right Arm)   Pulse 65   Temp 98.1 F (36.7 C) (Oral)   Resp 17   Ht 4\' 11"  (1.499 m)   Wt 72.6 kg (160 lb)   LMP 07/31/2017   SpO2 100%   BMI 32.32 kg/m   Physical Exam  Constitutional: She is oriented to person, place, and time. She appears well-developed and well-nourished. No distress.  HENT:  Head: Normocephalic.  Eyes: Conjunctivae are normal. Pupils are equal, round, and reactive to light. No scleral icterus.  No conjunctival injection.  Neck: Normal range of motion. Neck supple. No thyromegaly present.  Cardiovascular: Normal rate and regular rhythm. Exam reveals no gallop and no friction rub.  No murmur heard. She's not tachycardic. Normotensive.  Pulmonary/Chest: Effort normal and breath sounds normal. No respiratory  distress. She has no wheezes. She has no rales.  Abdominal: Soft. Bowel sounds are normal. She exhibits no distension. There is no tenderness. There is no rebound.  Musculoskeletal: Normal range of motion.  Neurological: She is alert and oriented to person, place, and time.  Skin: Skin is warm and dry. No rash noted.  Psychiatric: She has a normal mood and affect. Her behavior is normal.     ED Treatments / Results  Labs (all labs ordered are listed, but only abnormal results are displayed) Labs Reviewed  CBC  HIV ANTIBODY (ROUTINE TESTING)  RPR  I-STAT BETA HCG BLOOD, ED ([facility], [facility], [facility] ONLY)    EKG  EKG Interpretation None       Radiology No results found.  Procedures Procedures (including critical care time)  Medications Ordered in ED Medications  estrogens (conjugated) (PREMARIN) tablet 5 mg (not administered)  sodium chloride 0.9 % bolus 1,000 mL (not administered)     Initial Impression / Assessment and Plan / ED Course  I have reviewed the triage vital signs and the nursing notes.  Pertinent labs & imaging results that were available during my care of the patient were reviewed by me and considered in my medical decision making (see chart for details).    Patient had a pelvic exam with negative cultures but was treated with Zithromax and Rocephin last week. Did prove negative on testing. Is not acutely anemic.  Plan Premarin and Ferrous sulfate. GYN  F/u.    Final Clinical Impressions(s) / ED Diagnoses   Final diagnoses:  Dysfunctional uterine bleeding    ED Discharge Orders        Ordered    estrogens, conjugated, (PREMARIN) 1.25 MG tablet  Daily     09/05/17 1526    ferrous sulfate 325 (65 FE) MG tablet  Daily     09/05/17 1526       Rolland PorterJames, Shaqueena Mauceri, MD 09/05/17 1624

## 2017-09-06 LAB — RPR: RPR Ser Ql: NONREACTIVE

## 2017-09-06 LAB — HIV ANTIBODY (ROUTINE TESTING W REFLEX): HIV Screen 4th Generation wRfx: NONREACTIVE

## 2017-11-22 ENCOUNTER — Emergency Department (HOSPITAL_COMMUNITY)
Admission: EM | Admit: 2017-11-22 | Discharge: 2017-11-23 | Disposition: A | Discharge status: Home / Self Care | Payer: Self-pay | Attending: Emergency Medicine | Admitting: Emergency Medicine

## 2017-11-22 ENCOUNTER — Encounter (HOSPITAL_COMMUNITY): Payer: Self-pay | Admitting: Emergency Medicine

## 2017-11-22 ENCOUNTER — Other Ambulatory Visit: Payer: Self-pay

## 2017-11-22 DIAGNOSIS — Z79899 Other long term (current) drug therapy: Secondary | ICD-10-CM | POA: Insufficient documentation

## 2017-11-22 DIAGNOSIS — M5441 Lumbago with sciatica, right side: Secondary | ICD-10-CM | POA: Insufficient documentation

## 2017-11-22 DIAGNOSIS — Y92121 Bathroom in nursing home as the place of occurrence of the external cause: Secondary | ICD-10-CM | POA: Insufficient documentation

## 2017-11-22 DIAGNOSIS — M5442 Lumbago with sciatica, left side: Secondary | ICD-10-CM | POA: Insufficient documentation

## 2017-11-22 DIAGNOSIS — Y93F9 Activity, other caregiving: Secondary | ICD-10-CM | POA: Insufficient documentation

## 2017-11-22 DIAGNOSIS — W500XXA Accidental hit or strike by another person, initial encounter: Secondary | ICD-10-CM | POA: Insufficient documentation

## 2017-11-22 MED ORDER — HYDROCODONE-ACETAMINOPHEN 5-325 MG PO TABS
2.0000 | ORAL_TABLET | Freq: Once | ORAL | Status: AC
  Administered 2017-11-22: 2 via ORAL
  Filled 2017-11-22: qty 2

## 2017-11-22 NOTE — ED Notes (Signed)
Pt is alert and oriented x 4 and is verbally responsive and is accompanied with sign. Other. 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 Pt reports that she was on the floor with the patient for about 15 mins, waiting on help. Pt now is c/o lower back pain  That radiates to her legs and reports tingling and numbness to her legs and pain is described as shooting.

## 2017-11-22 NOTE — ED Triage Notes (Addendum)
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  Denies head injury and LOC. Hx scoliosis.

## 2017-11-23 ENCOUNTER — Emergency Department (HOSPITAL_COMMUNITY): Payer: Self-pay

## 2017-11-23 LAB — POC URINE PREG, ED: Preg Test, Ur: NEGATIVE

## 2017-11-23 MED ORDER — METHOCARBAMOL 500 MG PO TABS: 500.0000 mg | ORAL_TABLET | Freq: Two times a day (BID) | ORAL | 0 refills | Status: DC

## 2017-11-23 MED ORDER — KETOROLAC TROMETHAMINE 15 MG/ML IJ SOLN
15.0000 mg | Freq: Once | INTRAMUSCULAR | Status: AC
  Administered 2017-11-23: 15 mg via INTRAVENOUS
  Filled 2017-11-23: qty 1

## 2017-11-23 MED ORDER — LIDOCAINE 5 % EX PTCH
1.0000 | MEDICATED_PATCH | Freq: Once | CUTANEOUS | Status: DC
  Administered 2017-11-23: 1 via TRANSDERMAL
  Filled 2017-11-23: qty 1

## 2017-11-23 MED ORDER — NAPROXEN 375 MG PO TABS: 375.0000 mg | ORAL_TABLET | Freq: Two times a day (BID) | ORAL | 0 refills | Status: DC

## 2017-11-23 MED ORDER — LIDOCAINE 5 % EX PTCH: 1.0000 | MEDICATED_PATCH | CUTANEOUS | 0 refills | Status: DC

## 2017-11-23 NOTE — Discharge Instructions (Signed)
Workup has been normal. Please take medications as prescribed and instructed. ° °Please take the Naproxen as prescribed for pain. Do not take any additional NSAIDs including Motrin, Aleve, Ibuprofen, Advil. ° °Please the the robaxin for muscle relaxation. This medication will make you drowsy so avoid situation that could place you in danger.  ° ° °SEEK IMMEDIATE MEDICAL ATTENTION IF: °New numbness, tingling, weakness, or problem with the use of your arms or legs.  °Severe back pain not relieved with medications.  °Change in bowel or bladder control.  °Urinary retention.  °Numbness in your groin.  °Increasing pain in any areas of the body (such as chest or abdominal pain).  °Shortness of breath, dizziness or fainting.  °Nausea (feeling sick to your stomach), vomiting, fever, or sweats.  °

## 2017-11-23 NOTE — ED Provider Notes (Signed)
[facility]-EMERGENCY DEPT Provider Note   CSN: [id number] Arrival date & time: 11/22/17  1946     History   Chief Complaint Chief Complaint  Patient presents with  . Back Pain    HPI Candice Farrell is a 22 y.o. female.  HPI 22 year old African-American female with no pertinent past medical history presents to the emergency department today with complaints of low back pain that radiates to bilateral legs after mechanical injury.  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  Cause patient to fall backwards landing onto her buttocks.  Patient reports low back pain that radiates to bilateral legs.  States that she has not been able to ambulate with normal gait due to the pain.  Patient states that she has intermittent paresthesias down both legs.  She has not taking for the pain prior to arrival.  Range of motion and palpation makes the pain worse.  Nothing makes the pain better.  Denies any associated urinary symptoms. Pt denies any ha, night sweats, hx of ivdu/cancer, loss or bowel or bladder, urinary retention, saddle paresthesias, lower extremity paresthesias.  Patient denies any head injury or LOC.  Denies any abdominal pain, nausea, emesis, hematuria.     Past Medical History:  Diagnosis Date  . Anxiety   . Back pain   . Depression   . Scoliosis     Patient Active Problem List   Diagnosis Date Noted  . MDD (major depressive disorder), recurrent episode, moderate (HCC) 03/25/2014  . ODD (oppositional defiant disorder) 03/25/2014  . Post traumatic stress disorder (PTSD) 03/25/2014  . Cannabis abuse 03/25/2014    Past Surgical History:  Procedure Laterality Date  . BREAST CYST EXCISION      OB History    No data available       Home Medications    Prior to Admission medications   Medication Sig Start Date End Date Taking? Authorizing Provider  buPROPion (WELLBUTRIN XL) 300 MG 24 hr  tablet Take 1 tablet (300 mg total) by mouth daily. Patient not taking: Reported on 08/28/2017 04/01/14   Kendrick Fries, NP  Estradiol Valerate-Dienogest (NATAZIA) 3/2-2/2-3/1 MG tablet Take 1 tablet by mouth daily. Patient not taking: Reported on 09/05/2017 08/28/17   Margarita Grizzle, MD  estrogens, conjugated, (PREMARIN) 1.25 MG tablet Take 2 tablets (2.5 mg total) by mouth daily. 09/05/17   Rolland Porter, MD  ferrous sulfate 325 (65 FE) MG tablet Take 2 tablets (650 mg total) by mouth daily. 09/05/17   Rolland Porter, MD    Family History No family history on file.  Social History Social History   Tobacco Use  . Smoking status: Never Smoker  . Smokeless tobacco: Never Used  Substance Use Topics  . Alcohol use: Yes  . Drug use: No     Allergies   Patient has no known allergies.   Review of Systems Review of Systems  All other systems reviewed and are negative.    Physical Exam Updated Vital Signs BP 118/66 (BP Location: Left Arm)   Pulse 66   Temp 98.3 F (36.8 C) (Oral)   Resp 15   Ht 4\' 11"  (1.499 m)   Wt 69.9 kg (154 lb)   LMP 11/22/2017 Comment: neg preg test  SpO2 100%   BMI 31.10 kg/m   Physical Exam  Constitutional: She is oriented to person, place, and time. She appears well-developed and well-nourished. No distress.  HENT:  Head: Normocephalic and atraumatic.  Eyes: Right eye exhibits no discharge. Left eye exhibits no discharge. No scleral icterus.  Neck: Normal range of motion.  Cardiovascular: Intact distal pulses.  Pulmonary/Chest: No respiratory distress.  Abdominal: Soft. Bowel sounds are normal. She exhibits no distension. There is no tenderness. There is no rebound and no guarding.  Musculoskeletal: Normal range of motion.  No midline T spine No deformities or step offs noted. Full ROM. Pelvis is stable.  Patient with midline L-spine tenderness.  No deformities or step-offs noted.  Bilateral paraspinal tenderness.  Patient in severe pain with  only mild palpation of the lumbar spine.  The pain radiates to her bilateral upper buttocks is tender to palpation.  Positive straight leg raise test bilaterally that reproduces pain and paresthesias.  DP pulses are 2+ bilaterally.  Sensation intact in all dermatomes.  Brisk cap refill.   Neurological: She is alert and oriented to person, place, and time.  Strength equal in lower externally's bilaterally however slightly reduced secondary to pain.  No signs of weakness.  Normal patellar reflexes.  Sensation intact in all dermatomes.  Skin: Skin is warm and dry. Capillary refill takes less than 2 seconds. No pallor.  Psychiatric: Her behavior is normal. Judgment and thought content normal.  Nursing note and vitals reviewed.    ED Treatments / Results  Labs (all labs ordered are listed, but only abnormal results are displayed) Labs Reviewed  POC URINE PREG, ED    EKG  EKG Interpretation None       Radiology Dg Lumbar Spine Complete  Result Date: 11/23/2017 CLINICAL DATA:  Fall with low back pain EXAM: LUMBAR SPINE - COMPLETE 4+ VIEW COMPARISON:  Lumbar spine radiograph 01/21/2016 FINDINGS: There is no evidence of lumbar spine fracture. Alignment is normal. Intervertebral disc spaces are maintained. IMPRESSION: Normal lumbar spine radiographs. Electronically Signed   By: Deatra RobinsonKevin  Herman M.D.   On: 11/23/2017 01:34   Dg Sacrum/coccyx  Result Date: 11/23/2017 CLINICAL DATA:  Status post fall, with lower back pain and leg paresthesias. Initial encounter. EXAM: SACRUM AND COCCYX - 2+ VIEW COMPARISON:  None. FINDINGS: The sacrum and coccyx appear intact. The sacroiliac joints are unremarkable. The hip joints are unremarkable in appearance. The visualized bowel gas pattern is unremarkable. IMPRESSION: No evidence of fracture or dislocation. Electronically Signed   By: Roanna RaiderJeffery  Chang M.D.   On: 11/23/2017 01:34    Procedures Procedures (including critical care time)  Medications Ordered in  ED Medications  lidocaine (LIDODERM) 5 % 1 patch (1 patch Transdermal Patch Applied 11/23/17 0301)  HYDROcodone-acetaminophen (NORCO/VICODIN) 5-325 MG per tablet 2 tablet (2 tablets Oral Given 11/22/17 2231)  ketorolac (TORADOL) 15 MG/ML injection 15 mg (15 mg Intravenous Given 11/23/17 0301)     Initial Impression / Assessment and Plan / ED Course  I have reviewed the triage vital signs and the nursing notes.  Pertinent labs & imaging results that were available during my care of the patient were reviewed by me and considered in my medical decision making (see chart for details).     Patient presents to the ED with low back pain that radiates to bilateral legs with associated paresthesias after mechanical injury today.  Denies any red flag symptoms that be concerning for cauda equina.  Patient's pain is improved with hydrocodone, Toradol and lidocaine patch.  She is ambulatory at this time.  Remains neurovascularly intact.  X-rays revealed no acute findings.  Patient denies head injury or LOC.  States that  she feels stable for discharge at this time.  Discussed symptomatic care at home.  Will prescribe NSAIDs, muscle relaxers and lidocaine patches.  Discussed stretches with heat and ice application.  Pt is hemodynamically stable, in NAD, & able to ambulate in the ED. Evaluation does not show pathology that would require ongoing emergent intervention or inpatient treatment. I explained the diagnosis to the patient. Pain has been managed & has no complaints prior to dc. Pt is comfortable with above plan and is stable for discharge at this time. All questions were answered prior to disposition. Strict return precautions for f/u to the ED were discussed. Encouraged follow up with PCP.   Final Clinical Impressions(s) / ED Diagnoses   Final diagnoses:  Acute bilateral low back pain with bilateral sciatica    ED Discharge Orders        Ordered    methocarbamol (ROBAXIN) 500 MG tablet  2 times  daily     11/23/17 0329    naproxen (NAPROSYN) 375 MG tablet  2 times daily     11/23/17 0329    lidocaine (LIDODERM) 5 %  Every 24 hours     11/23/17 0329       Rise Mu, PA-C 11/23/17 0330    Palumbo, April, MD 11/23/17 [phone]

## 2018-02-14 ENCOUNTER — Encounter: Payer: Self-pay | Admitting: Obstetrics and Gynecology

## 2018-02-14 ENCOUNTER — Ambulatory Visit: Payer: Self-pay | Admitting: Obstetrics and Gynecology

## 2018-02-14 VITALS — BP 129/84 | HR 69 | Wt 144.1 lb

## 2018-02-14 DIAGNOSIS — N938 Other specified abnormal uterine and vaginal bleeding: Secondary | ICD-10-CM

## 2018-02-14 LAB — POCT URINALYSIS DIP (DEVICE)
Bilirubin Urine: NEGATIVE
GLUCOSE, UA: NEGATIVE mg/dL
Ketones, ur: NEGATIVE mg/dL
Leukocytes, UA: NEGATIVE
NITRITE: NEGATIVE
PROTEIN: NEGATIVE mg/dL
SPECIFIC GRAVITY, URINE: 1.025 (ref 1.005–1.030)
UROBILINOGEN UA: 0.2 mg/dL (ref 0.0–1.0)
pH: 6 (ref 5.0–8.0)

## 2018-02-14 LAB — POCT PREGNANCY, URINE: Preg Test, Ur: NEGATIVE

## 2018-02-14 NOTE — Patient Instructions (Signed)
Dysfunctional Uterine Bleeding °Dysfunctional uterine bleeding is abnormal bleeding from the uterus. Dysfunctional uterine bleeding includes: °· A period that comes earlier or later than usual. °· A period that is lighter, heavier, or has blood clots. °· Bleeding between periods. °· Skipping one or more periods. °· Bleeding after sexual intercourse. °· Bleeding after menopause. ° °Follow these instructions at home: °Pay attention to any changes in your symptoms. Follow these instructions to help with your condition: °Eating and drinking °· Eat well-balanced meals. Include foods that are high in iron, such as liver, meat, shellfish, green leafy vegetables, and eggs. °· If you become constipated: °? Drink plenty of water. °? Eat fruits and vegetables that are high in water and fiber, such as spinach, carrots, raspberries, apples, and mango. °Medicines °· Take over-the-counter and prescription medicines only as told by your health care provider. °· Do not change medicines without talking with your health care provider. °· Aspirin or medicines that contain aspirin may make the bleeding worse. Do not take those medicines: °? During the week before your period. °? During your period. °· If you were prescribed iron pills, take them as told by your health care provider. Iron pills help to replace iron that your body loses because of this condition. °Activity °· If you need to change your sanitary pad or tampon more than one time every 2 hours: °? Lie in bed with your feet raised (elevated). °? Place a cold pack on your lower abdomen. °? Rest as much as possible until the bleeding stops or slows down. °· Do not try to lose weight until the bleeding has stopped and your blood iron level is back to normal. °Other Instructions °· For two months, write down: °? When your period starts. °? When your period ends. °? When any abnormal bleeding occurs. °? What problems you notice. °· Keep all follow up visits as told by your health  care provider. This is important. °Contact a health care provider if: °· You get light-headed or weak. °· You have nausea and vomiting. °· You cannot eat or drink without vomiting. °· You feel dizzy or have diarrhea while you are taking medicines. °· You are taking birth control pills or hormones, and you want to change them or stop taking them. °Get help right away if: °· You develop a fever or chills. °· You need to change your sanitary pad or tampon more than one time per hour. °· Your bleeding becomes heavier, or your flow contains clots more often. °· You develop pain in your abdomen. °· You lose consciousness. °· You develop a rash. °This information is not intended to replace advice given to you by your health care provider. Make sure you discuss any questions you have with your health care provider. °Document Released: 08/27/2000 Document Revised: 02/05/2016 Document Reviewed: 11/25/2014 °Elsevier Interactive Patient Education © 2018 [employer] ° °

## 2018-02-15 ENCOUNTER — Telehealth: Payer: Self-pay | Admitting: General Practice

## 2018-02-15 LAB — CBC
HEMATOCRIT: 37 % (ref 34.0–46.6)
Hemoglobin: 12.4 g/dL (ref 11.1–15.9)
MCH: 30.8 pg (ref 26.6–33.0)
MCHC: 33.5 g/dL (ref 31.5–35.7)
MCV: 92 fL (ref 79–97)
Platelets: 217 10*3/uL (ref 150–450)
RBC: 4.03 x10E6/uL (ref 3.77–5.28)
RDW: 15.5 % — ABNORMAL HIGH (ref 12.3–15.4)
WBC: 7.3 10*3/uL (ref 3.4–10.8)

## 2018-02-15 LAB — TSH: TSH: 1.37 u[IU]/mL (ref 0.450–4.500)

## 2018-02-15 LAB — HEMOGLOBIN A1C
Est. average glucose Bld gHb Est-mCnc: 111 mg/dL
Hgb A1c MFr Bld: 5.5 % (ref 4.8–5.6)

## 2018-02-15 LAB — TESTOSTERONE: TESTOSTERONE: 17 ng/dL (ref 8–48)

## 2018-02-15 NOTE — Telephone Encounter (Signed)
Patient aware of appointment time change on 03/01/18 at 11:15am with Dr. Marice Potterove.  Patient verbalized understanding.

## 2018-02-16 ENCOUNTER — Encounter: Payer: Self-pay | Admitting: Obstetrics and Gynecology

## 2018-02-16 NOTE — Progress Notes (Signed)
GYNECOLOGY OFFICE VISIT NOTE  History:  22 y.o. No obstetric history on file. here today for abnormal vaginal bleeding. Patient states that she has been to the ED several times in the last few months due to heavy irregular vaginal bleeding.  Patient states that she has had syncopal episodes associated with her bleeding. Once in the Ed that gave her progestin which helped control bleeding. Patient first started having menstrual period around 22 years of age.  States that they were always irregular.  She was regulated on birth control but stopped taking in 2017 due to trying to conceive.  Since she stopped her periods have become irregular again.  States that she has months where she will bleed a whole month and then other months where she misses a period.  States that she bled the whole month of January and then had some spotting in February but has not had no bleeding in the last 3 months.  She denies any abnormal vaginal discharge, bleeding, pelvic pain or other concerns.   Past Medical History:  Diagnosis Date  . Anxiety   . Back pain   . Depression   . Scoliosis   -No known medical conditions; has no PCP  The patient has a family history of PCOS and some form of uterine or ovarian cancer in an aunt.   Past Surgical History:  Procedure Laterality Date  . BREAST CYST EXCISION     Current Outpatient Medications on File Prior to Visit  Medication Sig Dispense Refill  . buPROPion (WELLBUTRIN XL) 300 MG 24 hr tablet Take 1 tablet (300 mg total) by mouth daily. (Patient not taking: Reported on 08/28/2017) 30 tablet 0  . Estradiol Valerate-Dienogest (NATAZIA) 3/2-2/2-3/1 MG tablet Take 1 tablet by mouth daily. (Patient not taking: Reported on 09/05/2017) 1 Package 11  . estrogens, conjugated, (PREMARIN) 1.25 MG tablet Take 2 tablets (2.5 mg total) by mouth daily. 10 tablet 0  . ferrous sulfate 325 (65 FE) MG tablet Take 2 tablets (650 mg total) by mouth daily. 60 tablet 0  . lidocaine  (LIDODERM) 5 % Place 1 patch onto the skin daily. Remove & Discard patch within 12 hours or as directed by MD 30 patch 0  . methocarbamol (ROBAXIN) 500 MG tablet Take 1 tablet (500 mg total) by mouth 2 (two) times daily. 10 tablet 0  . naproxen (NAPROSYN) 375 MG tablet Take 1 tablet (375 mg total) by mouth 2 (two) times daily. 20 tablet 0   No current facility-administered medications on file prior to visit.     The following portions of the patient's history were reviewed and updated as appropriate: allergies, current medications, past family history, past medical history, past social history, past surgical history and problem list.   Health Maintenance:  Normal pap in 2017 per patient.    Review of Systems:  Pertinent items noted in HPI and remainder of comprehensive ROS otherwise negative.   Objective:  Physical Exam BP 129/84   Pulse 69   Wt 144 lb 1.6 oz (65.4 kg)   LMP 10/22/2017 (Approximate)   BMI 29.10 kg/m  CONSTITUTIONAL: Well-developed, well-nourished female in no acute distress.  HENT:  Normocephalic, atraumatic. External right and left ear normal. Oropharynx is clear and moist EYES: Conjunctivae and EOM are normal. Pupils are equal, round, and reactive to light. No scleral icterus.  NECK: Normal range of motion, supple, no masses. No signs of acanthosis nigricans. Thyroid normal size without nodules. SKIN: Skin is warm and dry. No  rash noted. Not diaphoretic. No erythema. No pallor. NEUROLOGIC: Alert and oriented to person, place, and time. Normal muscle tone coordination. No cranial nerve deficit noted. PSYCHIATRIC: Normal mood and affect. Normal behavior. Normal judgment and thought content. CARDIOVASCULAR: Normal heart rate noted RESPIRATORY: Effort and breath sounds normal, no problems with respiration noted. ABDOMEN: Soft, no distention noted.  Non-tender.  PELVIC: Normal appearing external genitalia; normal appearing vaginal mucosa and cervix.  No abnormal  discharge noted.  Normal uterine size, no other palpable masses, no uterine or adnexal tenderness. MUSCULOSKELETAL: Normal range of motion. No edema noted.  Labs and Imaging Upreg: NEGATVIE  Assessment & Plan:  1. DUB (dysfunctional uterine bleeding) Pregnancy test negative.  Bleeding pattern most consistent with abnormal uterine bleeding associated with ovulatory dysfunction. Discussed with patient estrogen-progestin contraceptives have the advantages of providing contraception and regulating bleeding. Patient declines as she is trying to conceive. Will obtain initial lab work to rule out common reasons why she could have DUB. Pelvic US also ordered. With patient's family history of female cancers and PCOS need to be cautious if she has unopposed estrogen.   - TSH - US Pelvis Complete; Future - CBC - Hemoglobin A1c - Testosterone  Please refer to After Visit Summary for other counseling recommendations.   Return in about 2 weeks (around 02/28/2018) for DUB follow-up.  Total face-to-face time with patient: 25 minutes. Over 50% of encounter was spent on counseling and coordination of care.  Caryl AdaJazma Phelps, DO OB Fellow [facility] for [facility]

## 2018-02-21 ENCOUNTER — Ambulatory Visit (HOSPITAL_COMMUNITY)
Admission: RE | Admit: 2018-02-21 | Discharge: 2018-02-21 | Disposition: A | Discharge status: Home / Self Care | Payer: Medicaid Other | Source: Ambulatory Visit | Attending: Obstetrics and Gynecology | Admitting: Obstetrics and Gynecology

## 2018-02-21 DIAGNOSIS — N938 Other specified abnormal uterine and vaginal bleeding: Secondary | ICD-10-CM

## 2018-02-21 DIAGNOSIS — N939 Abnormal uterine and vaginal bleeding, unspecified: Secondary | ICD-10-CM | POA: Insufficient documentation

## 2018-03-01 ENCOUNTER — Ambulatory Visit: Payer: Medicaid Other | Admitting: Obstetrics & Gynecology

## 2018-03-01 ENCOUNTER — Ambulatory Visit: Payer: Medicaid Other | Admitting: Student

## 2018-03-01 NOTE — Progress Notes (Signed)
Per Dr. Dove pt does not need to be contacted in regards to missed appt.   

## 2018-03-02 ENCOUNTER — Ambulatory Visit: Payer: Medicaid Other | Admitting: Student

## 2018-03-07 ENCOUNTER — Encounter: Payer: Self-pay | Admitting: Obstetrics and Gynecology

## 2018-03-07 ENCOUNTER — Other Ambulatory Visit: Payer: Self-pay

## 2018-03-07 ENCOUNTER — Ambulatory Visit (INDEPENDENT_AMBULATORY_CARE_PROVIDER_SITE_OTHER): Payer: Self-pay | Admitting: Obstetrics and Gynecology

## 2018-03-07 VITALS — BP 114/57 | HR 68 | Wt 148.0 lb

## 2018-03-07 DIAGNOSIS — F331 Major depressive disorder, recurrent, moderate: Secondary | ICD-10-CM

## 2018-03-07 DIAGNOSIS — N938 Other specified abnormal uterine and vaginal bleeding: Secondary | ICD-10-CM

## 2018-03-07 MED ORDER — NORGESTIMATE-ETH ESTRADIOL 0.25-35 MG-MCG PO TABS
1.0000 | ORAL_TABLET | Freq: Every day | ORAL | 11 refills | Status: DC
Start: 2018-03-07 — End: 2018-05-08

## 2018-03-07 MED ORDER — SERTRALINE HCL 25 MG PO TABS: 25.0000 mg | ORAL_TABLET | Freq: Every day | ORAL | 2 refills | Status: DC

## 2018-03-07 NOTE — Patient Instructions (Signed)
Anovulatory or Dysfunctional Uterine Bleeding (DUB) Dysfunctional uterine bleeding is the occurrence of uterine bleeding unrelated to structural abnormalities of the uterus or the endometrial lining. It is a diagnosis of exclusion made after structural causes of bleeding and chronic medical diseases have been ruled out. Other causes of abnormal bleeding must also be ruled out, including pregnancy complications and medications that influence hormonal action or affect clotting. Dysfunctional bleeding occurs more commonly in the first five years after a woman starts menstruating and as she approaches menopause, but it can occur at any time period. The cause of DUB is anovulation, the absence of ovulation and the orderly secretion of estrogen and progesterone, and may alert the woman and her physician to the fact that she is no longer ovulating normally.

## 2018-03-10 DIAGNOSIS — N938 Other specified abnormal uterine and vaginal bleeding: Secondary | ICD-10-CM | POA: Insufficient documentation

## 2018-03-10 NOTE — Progress Notes (Signed)
   GYNECOLOGY OFFICE VISIT NOTE  History:  22 y.o. G0. here today for follow-up on her DUB. Denies any bleeding since last visit but is still endorsing pain that  fluctuates in intensity. Wondering if she has endometriosis. Denies pain with intercourse. Pelvic pain is constant. Feeling more depressed with what is going on. Has h/o depression that was treated with medication. Feels like she wants to be back on medication. She denies any abnormal vaginal discharge, bleeding, fever or other concerns.   Past Medical History:  Diagnosis Date  . Anxiety   . Back pain   . Depression   . Scoliosis   -No known medical conditions; has no PCP  The patient has a family history of PCOS and some form of uterine or ovarian cancer in an aunt.   Past Surgical History:  Procedure Laterality Date  . BREAST CYST EXCISION     Current Outpatient Medications on File Prior to Visit  Medication Sig Dispense Refill  . NON FORMULARY CBD oil     No current facility-administered medications on file prior to visit.     The following portions of the patient's history were reviewed and updated as appropriate: allergies, current medications, past family history, past medical history, past social history, past surgical history and problem list.   Health Maintenance:  Normal pap in 2017 per patient.    Review of Systems:  Pertinent items noted in HPI.   Objective:  Physical Exam BP (!) 114/57   Pulse 68   Wt 67.1 kg (148 lb)   LMP 02/26/2018   BMI 29.89 kg/m  CONSTITUTIONAL: Well-developed, well-nourished female in no acute distress.  PSYCHIATRIC: Depressed mood and affect. Normal behavior. Normal judgment and thought content. CARDIOVASCULAR: Normal heart rate noted RESPIRATORY: Effort and breath sounds normal, no problems with respiration noted. ABDOMEN: Soft, no distention noted.  Non-tender.  MUSCULOSKELETAL: Normal range of motion. No edema noted.  Labs and Imaging   Assessment & Plan:  1. DUB  (dysfunctional uterine bleeding) Reviewed recent blood work and imaging. No signs of PCOS or metabolic disease state that could be causing patient's DUB. US with no anatomical abnormalities. Patient would like to hold off on getting pregnant after talking to her partner and instead wants to go back on OCPs. If she desires fertility she will need to be referred to an REI doctor for likely ovulatory dysfunction causing infertility and DUB.   2. MDD (major depressive disorder), recurrent episode, moderate (HCC) Feels like she needs something for her mood. Previously been on Wellbutrin but did not like side effects. Rx for Zoloft given. Patient encouraged to find mental health provider and PCP to help further manage her depression.   Please refer to After Visit Summary for other counseling recommendations.   Total face-to-face time with patient: 25 minutes. Over 50% of encounter was spent on counseling and coordination of care.  Caryl AdaJazma Karaline Buresh, DO OB Fellow Center for [facility]

## 2018-05-08 ENCOUNTER — Encounter (HOSPITAL_COMMUNITY): Payer: Self-pay

## 2018-05-08 ENCOUNTER — Ambulatory Visit (HOSPITAL_COMMUNITY)
Admission: EM | Admit: 2018-05-08 | Discharge: 2018-05-08 | Disposition: A | Discharge status: Home / Self Care | Payer: Medicaid Other | Attending: Family Medicine | Admitting: Family Medicine

## 2018-05-08 DIAGNOSIS — R5383 Other fatigue: Secondary | ICD-10-CM

## 2018-05-08 DIAGNOSIS — N938 Other specified abnormal uterine and vaginal bleeding: Secondary | ICD-10-CM

## 2018-05-08 DIAGNOSIS — Z3202 Encounter for pregnancy test, result negative: Secondary | ICD-10-CM

## 2018-05-08 DIAGNOSIS — M545 Low back pain: Secondary | ICD-10-CM

## 2018-05-08 LAB — POCT PREGNANCY, URINE: PREG TEST UR: NEGATIVE

## 2018-05-08 NOTE — ED Provider Notes (Signed)
[facility]    CSN: [id number] Arrival date & time: 05/08/18  1415     History   Chief Complaint Chief Complaint  Patient presents with  . Possible Pregnancy    HPI Candice Farrell is a 22 y.o. female.   HPI  She is here for pregnancy test.  She states her last menstrual period was 04/03/2018.  She has had some spotting.  She had some cramping.  She feels fatigued.  She is also had some aching in her low back.  She works as a LawyerCNA.  I informed her that she is not pregnant.  She wanted to know what is wrong with her, then.  Past Medical History:  Diagnosis Date  . Anxiety   . Back pain   . Depression   . Scoliosis     Patient Active Problem List   Diagnosis Date Noted  . DUB (dysfunctional uterine bleeding) 03/10/2018  . MDD (major depressive disorder), recurrent episode, moderate (HCC) 03/25/2014  . ODD (oppositional defiant disorder) 03/25/2014  . Post traumatic stress disorder (PTSD) 03/25/2014  . Cannabis abuse 03/25/2014    Past Surgical History:  Procedure Laterality Date  . BREAST CYST EXCISION      OB History   None      Home Medications    Prior to Admission medications   Medication Sig Start Date End Date Taking? Authorizing Provider  NON FORMULARY CBD oil    [provider]  sertraline (ZOLOFT) 25 MG tablet Take 1 tablet (25 mg total) by mouth daily. 03/07/18   Pincus LargePhelps, Jazma Y, DO    Family History History reviewed. No pertinent family history.  Social History Social History   Tobacco Use  . Smoking status: Never Smoker  . Smokeless tobacco: Never Used  Substance Use Topics  . Alcohol use: Yes  . Drug use: No     Allergies   Patient has no known allergies.   Review of Systems Review of Systems  Constitutional: Positive for fatigue. Negative for chills and fever.  HENT: Negative for ear pain and sore throat.   Eyes: Negative for pain and visual disturbance.  Respiratory: Negative for cough and shortness of  breath.   Cardiovascular: Negative for chest pain and palpitations.  Gastrointestinal: Negative for abdominal pain and vomiting.  Genitourinary: Positive for menstrual problem. Negative for dysuria and hematuria.  Musculoskeletal: Positive for back pain. Negative for arthralgias.  Skin: Negative for color change and rash.  Neurological: Negative for seizures and syncope.  All other systems reviewed and are negative.    Physical Exam Triage Vital Signs ED Triage Vitals  Enc Vitals Group     BP 05/08/18 1426 132/83     Pulse Rate 05/08/18 1426 67     Resp 05/08/18 1426 16     Temp 05/08/18 1426 98.2 F (36.8 C)     Temp Source 05/08/18 1426 Oral     SpO2 05/08/18 1426 100 %     Weight --      Height --      Head Circumference --      Peak Flow --      Pain Score 05/08/18 1433 0     Pain Loc --      Pain Edu? --      Excl. in GC? --    No data found.  Updated Vital Signs BP 132/83 (BP Location: Left Arm)   Pulse 67   Temp 98.2 F (36.8 C) (Oral)   Resp  16   LMP 04/09/2018   SpO2 100%   Visual Acuity Right Eye Distance:   Left Eye Distance:   Bilateral Distance:    Right Eye Near:   Left Eye Near:    Bilateral Near:     Physical Exam  Constitutional: She appears well-developed and well-nourished. No distress.  HENT:  Head: Normocephalic and atraumatic.  Mouth/Throat: Oropharynx is clear and moist.  Eyes: Pupils are equal, round, and reactive to light. Conjunctivae are normal.  Neck: Normal range of motion.  Cardiovascular: Normal rate, regular rhythm and normal heart sounds.  Pulmonary/Chest: Effort normal and breath sounds normal. No respiratory distress.  Abdominal: Soft. Bowel sounds are normal. She exhibits no distension. There is no tenderness.  Musculoskeletal: Normal range of motion. She exhibits no edema.  Lumbar spine is straight and symmetric. Full range of motion. No tenderness or muscle spasm. Strength, sensation, range of motion, and reflexes are  normal in both lower extremities. Straight leg raise is negative bilateral.   Neurological: She is alert.  Skin: Skin is warm and dry.  Psychiatric: She has a normal mood and affect.     UC Treatments / Results  Labs (all labs ordered are listed, but only abnormal results are displayed) Labs Reviewed  POCT PREGNANCY, URINE   Pregnancy neg EKG None  Radiology No results found.  Procedures Procedures (including critical care time)  Medications Ordered in UC Medications - No data to display  Initial Impression / Assessment and Plan / UC Course  I have reviewed the triage vital signs and the nursing notes.  Pertinent labs & imaging results that were available during my care of the patient were reviewed by me and considered in my medical decision making (see chart for details).     Final Clinical Impressions(s) / UC Diagnoses   Final diagnoses:  DUB (dysfunctional uterine bleeding)     Discharge Instructions     See GYN in follow up May take ibuprofen or aleve for cramping    ED Prescriptions    None     Controlled Substance Prescriptions [place] Controlled Substance Registry consulted? Not Applicable   Eustace Moore, MD 05/08/18 [phone]

## 2018-05-08 NOTE — ED Triage Notes (Signed)
Pt presents to have a pregnancy test done because of a possible pregnancy.

## 2018-05-08 NOTE — Discharge Instructions (Signed)
See GYN in follow up May take ibuprofen or aleve for cramping

## 2018-05-16 ENCOUNTER — Other Ambulatory Visit: Payer: Self-pay

## 2018-05-16 ENCOUNTER — Encounter (HOSPITAL_COMMUNITY): Payer: Self-pay | Admitting: *Deleted

## 2018-05-16 ENCOUNTER — Emergency Department (HOSPITAL_COMMUNITY)
Admission: EM | Admit: 2018-05-16 | Discharge: 2018-05-16 | Disposition: A | Discharge status: Home / Self Care | Payer: Medicaid Other | Attending: Emergency Medicine | Admitting: Emergency Medicine

## 2018-05-16 DIAGNOSIS — R51 Headache: Secondary | ICD-10-CM | POA: Insufficient documentation

## 2018-05-16 DIAGNOSIS — Z5321 Procedure and treatment not carried out due to patient leaving prior to being seen by health care provider: Secondary | ICD-10-CM | POA: Insufficient documentation

## 2018-05-16 DIAGNOSIS — R1084 Generalized abdominal pain: Secondary | ICD-10-CM | POA: Insufficient documentation

## 2018-05-16 DIAGNOSIS — R110 Nausea: Secondary | ICD-10-CM | POA: Insufficient documentation

## 2018-05-16 LAB — LIPASE, BLOOD: LIPASE: 28 U/L (ref 11–51)

## 2018-05-16 LAB — COMPREHENSIVE METABOLIC PANEL
ALT: 17 U/L (ref 0–44)
ANION GAP: 7 (ref 5–15)
AST: 20 U/L (ref 15–41)
Albumin: 4.1 g/dL (ref 3.5–5.0)
Alkaline Phosphatase: 49 U/L (ref 38–126)
BUN: 9 mg/dL (ref 6–20)
CHLORIDE: 107 mmol/L (ref 98–111)
CO2: 26 mmol/L (ref 22–32)
CREATININE: 0.74 mg/dL (ref 0.44–1.00)
Calcium: 9.1 mg/dL (ref 8.9–10.3)
Glucose, Bld: 85 mg/dL (ref 70–99)
Potassium: 4 mmol/L (ref 3.5–5.1)
SODIUM: 140 mmol/L (ref 135–145)
Total Bilirubin: 0.5 mg/dL (ref 0.3–1.2)
Total Protein: 7.1 g/dL (ref 6.5–8.1)

## 2018-05-16 LAB — CBC
HEMATOCRIT: 37.5 % (ref 36.0–46.0)
HEMOGLOBIN: 11.9 g/dL — AB (ref 12.0–15.0)
MCH: 31.2 pg (ref 26.0–34.0)
MCHC: 31.7 g/dL (ref 30.0–36.0)
MCV: 98.2 fL (ref 78.0–100.0)
PLATELETS: 200 10*3/uL (ref 150–400)
RBC: 3.82 MIL/uL — AB (ref 3.87–5.11)
RDW: 12.3 % (ref 11.5–15.5)
WBC: 9.4 10*3/uL (ref 4.0–10.5)

## 2018-05-16 LAB — I-STAT BETA HCG BLOOD, ED (MC, WL, AP ONLY): I-stat hCG, quantitative: <5 m[IU]/mL (ref <5)

## 2018-05-16 NOTE — ED Triage Notes (Signed)
Pt reports having generalized abd pain and headache x 2 weeks with nausea. Requesting pregnancy test.

## 2018-05-16 NOTE — ED Notes (Signed)
Pt states she can not wait any longer to be seen. Pt unwilling to stay. Pt left without being seen

## 2018-09-13 NOTE — L&D Delivery Note (Signed)
Delivery Note At  a viable female was delivered via  (Presentation:LOA  ;  ).  APGAR: , ; weight  pending.   Placenta status:spontaneous intact with trailing membranes . 3V Cord:  with the following complications:thick mec with nuchal cord x 1 loose .  Cord clamped and cut baby to warmer, NICU called Anesthesia:  Epidural Episiotomy:  None Lacerations:  right labial 1st Suture Repair: 4-0 vicryl Est. Blood Loss (mL):300cc     Mom to postpartum.  Baby to Couplet care / Skin to Skin.  Candice Farrell 05/31/2019, 11:45 PM

## 2018-12-07 LAB — OB RESULTS CONSOLE GC/CHLAMYDIA
Chlamydia: NEGATIVE
Gonorrhea: NEGATIVE

## 2018-12-07 LAB — OB RESULTS CONSOLE ANTIBODY SCREEN: Antibody Screen: NEGATIVE

## 2018-12-07 LAB — OB RESULTS CONSOLE HEPATITIS B SURFACE ANTIGEN: Hepatitis B Surface Ag: NEGATIVE

## 2018-12-07 LAB — OB RESULTS CONSOLE HIV ANTIBODY (ROUTINE TESTING): HIV: NONREACTIVE

## 2018-12-07 LAB — OB RESULTS CONSOLE ABO/RH: RH Type: POSITIVE

## 2018-12-07 LAB — OB RESULTS CONSOLE RUBELLA ANTIBODY, IGM: Rubella: IMMUNE

## 2018-12-07 LAB — OB RESULTS CONSOLE RPR: RPR: NONREACTIVE

## 2019-02-14 ENCOUNTER — Encounter (HOSPITAL_COMMUNITY): Payer: Self-pay | Admitting: Pharmacy Technician

## 2019-02-14 ENCOUNTER — Other Ambulatory Visit: Payer: Self-pay

## 2019-02-14 ENCOUNTER — Inpatient Hospital Stay (HOSPITAL_COMMUNITY)
Admission: AD | Admit: 2019-02-14 | Discharge: 2019-02-14 | Disposition: A | Discharge status: Other Acute Inpatient Hospital | Payer: No Typology Code available for payment source | Source: Ambulatory Visit | Attending: Obstetrics & Gynecology | Admitting: Obstetrics & Gynecology

## 2019-02-14 DIAGNOSIS — Z79899 Other long term (current) drug therapy: Secondary | ICD-10-CM | POA: Insufficient documentation

## 2019-02-14 DIAGNOSIS — O9A212 Injury, poisoning and certain other consequences of external causes complicating pregnancy, second trimester: Secondary | ICD-10-CM | POA: Insufficient documentation

## 2019-02-14 DIAGNOSIS — O99352 Diseases of the nervous system complicating pregnancy, second trimester: Secondary | ICD-10-CM | POA: Insufficient documentation

## 2019-02-14 DIAGNOSIS — O99322 Drug use complicating pregnancy, second trimester: Secondary | ICD-10-CM | POA: Diagnosis not present

## 2019-02-14 DIAGNOSIS — Y92410 Unspecified street and highway as the place of occurrence of the external cause: Secondary | ICD-10-CM | POA: Diagnosis not present

## 2019-02-14 DIAGNOSIS — S3991XA Unspecified injury of abdomen, initial encounter: Secondary | ICD-10-CM | POA: Diagnosis not present

## 2019-02-14 DIAGNOSIS — Y998 Other external cause status: Secondary | ICD-10-CM | POA: Diagnosis not present

## 2019-02-14 DIAGNOSIS — R51 Headache: Secondary | ICD-10-CM

## 2019-02-14 DIAGNOSIS — Z333 Pregnant state, gestational carrier: Secondary | ICD-10-CM

## 2019-02-14 DIAGNOSIS — F419 Anxiety disorder, unspecified: Secondary | ICD-10-CM | POA: Insufficient documentation

## 2019-02-14 DIAGNOSIS — F1210 Cannabis abuse, uncomplicated: Secondary | ICD-10-CM | POA: Insufficient documentation

## 2019-02-14 DIAGNOSIS — O99342 Other mental disorders complicating pregnancy, second trimester: Secondary | ICD-10-CM | POA: Diagnosis not present

## 2019-02-14 DIAGNOSIS — M419 Scoliosis, unspecified: Secondary | ICD-10-CM | POA: Insufficient documentation

## 2019-02-14 DIAGNOSIS — F329 Major depressive disorder, single episode, unspecified: Secondary | ICD-10-CM | POA: Insufficient documentation

## 2019-02-14 DIAGNOSIS — O9989 Other specified diseases and conditions complicating pregnancy, childbirth and the puerperium: Secondary | ICD-10-CM | POA: Diagnosis not present

## 2019-02-14 DIAGNOSIS — O26892 Other specified pregnancy related conditions, second trimester: Secondary | ICD-10-CM

## 2019-02-14 DIAGNOSIS — Z3A25 25 weeks gestation of pregnancy: Secondary | ICD-10-CM

## 2019-02-14 DIAGNOSIS — Y9389 Activity, other specified: Secondary | ICD-10-CM | POA: Diagnosis not present

## 2019-02-14 DIAGNOSIS — R5381 Other malaise: Secondary | ICD-10-CM | POA: Diagnosis not present

## 2019-02-14 LAB — URINALYSIS, ROUTINE W REFLEX MICROSCOPIC
Bilirubin Urine: NEGATIVE
Glucose, UA: NEGATIVE mg/dL
Hgb urine dipstick: NEGATIVE
Ketones, ur: 80 mg/dL — AB
Leukocytes,Ua: NEGATIVE
Nitrite: NEGATIVE
Protein, ur: 30 mg/dL — AB
Specific Gravity, Urine: 1.027 (ref 1.005–1.030)
pH: 6 (ref 5.0–8.0)

## 2019-02-14 MED ORDER — ACETAMINOPHEN 500 MG PO TABS
1000.0000 mg | ORAL_TABLET | Freq: Once | ORAL | Status: AC
  Administered 2019-02-14: 1000 mg via ORAL
  Filled 2019-02-14: qty 2

## 2019-02-14 NOTE — ED Notes (Signed)
Erin (OB Rapid Response RN) called @ 1800-per Lowella Bandy, RN called by Marylene Land
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
## 2019-02-14 NOTE — Discharge Instructions (Signed)
Preventing Injuries During Pregnancy °Trauma is the most common cause of injury and death in pregnant women. This can also result in serious harm to the baby or even death. °How can injuries affect my pregnancy? °Your baby is protected in the womb (uterus) by a sac filled with fluid (amniotic sac). Your baby can be harmed if there is a direct blow to your abdomen and pelvis. Trauma may be caused by: °· Falls. These are more common in the second and third trimester of pregnancy. °· Automobile accidents. °· Domestic violence or assault. °· Severe burns, such as from fire or electricity. °These injuries can result in: °· Tearing of your uterus. °· The placenta pulling away from the wall of the uterus (placental abruption). °· The amniotic sac breaking open (rupture of membranes). °· Blockage or decrease in the blood supply to your baby. °· Going into labor earlier than expected. °· Severe injuries to other parts of your body, such as your brain, spine, heart, lungs, or other organs. °Minor falls and low-impact automobile accidents do not usually harm your baby, even if they cause a little harm to you. °What can I do to lower my risk? °Safety °· Remove slippery rugs and loose objects on the floor. They increase your risk of tripping or slipping. °· Wear comfortable shoes that have a good grip on the sole. Do not wear high-heeled shoes. °· Always wear your seat belt properly when riding in a car. Use both the lap and shoulder belt, with the lap belt below your abdomen. Always practice safe driving. Do not ride on a motorcycle while pregnant. °Activity °· Avoid walking on wet or slippery floors. °· Do not participate in rough and violent activities or sports. °· Avoid high-risk situations and activities such as: °? Lifting heavy pots of boiling or hot liquids. °? Fixing electrical problems. °? Being near fires or starting fires. °General instructions °· Take over-the-counter and prescription medicines only as told by your  health care provider. °· Know your blood type and the father's blood type in case you develop vaginal bleeding or experience an injury for which a blood transfusion is needed. °· Spousal abuse can be a serious cause of trauma during pregnancy. If you are a victim of domestic violence or assault: °? Call your local emergency services (911 in the [place] °? Contact the National Domestic Violence Hotline for help and support. °When should I seek immediate medical care? °Get help right away if: °· You fall on your abdomen or experience any serious blow to your abdomen. °· You develop stiffness in your neck or pain after a fall or from other trauma. °· You develop a headache or vision problems after a fall or from other trauma. °· You do not feel the baby moving after a fall or trauma, or you feel that the baby is not moving as much as before the fall or trauma. °· You have been the victim of domestic violence or any other kind of physical attack. °· You have been in a car accident. °· You develop vaginal bleeding. °· You have fluid leaking from the vagina. °· You develop uterine contractions. Symptoms include pelvic cramping, pain, or serious low back pain. °· You become weak, faint, or have uncontrolled vomiting after trauma. °· You have a serious burn. This includes burns to the face, neck, hands, or genitals, or burns greater than the size of your palm anywhere else. °Summary °· Trauma is the most common cause of injury and death   in pregnant women and can also lead to injury or death of the baby. °· Falls, automobile accidents, domestic violence or assault, and severe burns can injure you or your baby. Make sure to get medical help right away if you experience any of these during your pregnancy. °· Take steps to prevent slips or falls in your home, such as avoiding slippery floors and removing loose rugs. °· Always wear your seat belt properly when riding in a car. Practice safe driving. °This information is not  intended to replace advice given to you by your health care provider. Make sure you discuss any questions you have with your health care provider. °Document Released: 10/07/2004 Document Revised: 09/08/2016 Document Reviewed: 09/08/2016 °Elsevier Interactive Patient Education © 2019 [employer] Inc[employer] ° °
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
## 2019-03-22 DIAGNOSIS — O26843 Uterine size-date discrepancy, third trimester: Secondary | ICD-10-CM | POA: Diagnosis not present

## 2019-03-22 DIAGNOSIS — Z23 Encounter for immunization: Secondary | ICD-10-CM | POA: Diagnosis not present

## 2019-03-22 DIAGNOSIS — Z3483 Encounter for supervision of other normal pregnancy, third trimester: Secondary | ICD-10-CM | POA: Diagnosis not present

## 2019-04-04 DIAGNOSIS — O26843 Uterine size-date discrepancy, third trimester: Secondary | ICD-10-CM | POA: Diagnosis not present

## 2019-04-04 DIAGNOSIS — Z3483 Encounter for supervision of other normal pregnancy, third trimester: Secondary | ICD-10-CM | POA: Diagnosis not present

## 2019-04-18 DIAGNOSIS — Z3483 Encounter for supervision of other normal pregnancy, third trimester: Secondary | ICD-10-CM | POA: Diagnosis not present

## 2019-04-18 DIAGNOSIS — N898 Other specified noninflammatory disorders of vagina: Secondary | ICD-10-CM | POA: Diagnosis not present

## 2019-05-03 DIAGNOSIS — Z3403 Encounter for supervision of normal first pregnancy, third trimester: Secondary | ICD-10-CM | POA: Diagnosis not present

## 2019-05-03 LAB — OB RESULTS CONSOLE GBS: GBS: NEGATIVE

## 2019-05-17 DIAGNOSIS — O26843 Uterine size-date discrepancy, third trimester: Secondary | ICD-10-CM | POA: Diagnosis not present

## 2019-05-17 DIAGNOSIS — Z3403 Encounter for supervision of normal first pregnancy, third trimester: Secondary | ICD-10-CM | POA: Diagnosis not present

## 2019-05-22 ENCOUNTER — Telehealth (HOSPITAL_COMMUNITY): Payer: Self-pay | Admitting: *Deleted

## 2019-05-22 ENCOUNTER — Encounter (HOSPITAL_COMMUNITY): Payer: Self-pay | Admitting: *Deleted

## 2019-05-22 NOTE — Telephone Encounter (Signed)
Preadmission screen  

## 2019-05-30 DIAGNOSIS — O368130 Decreased fetal movements, third trimester, not applicable or unspecified: Secondary | ICD-10-CM | POA: Diagnosis not present

## 2019-05-30 DIAGNOSIS — Z3403 Encounter for supervision of normal first pregnancy, third trimester: Secondary | ICD-10-CM | POA: Diagnosis not present

## 2019-05-30 DIAGNOSIS — K219 Gastro-esophageal reflux disease without esophagitis: Secondary | ICD-10-CM | POA: Diagnosis not present

## 2019-05-31 ENCOUNTER — Inpatient Hospital Stay (HOSPITAL_COMMUNITY)
Admission: AD | Admit: 2019-05-31 | Discharge: 2019-06-02 | DRG: 807 | Disposition: A | Discharge status: Home / Self Care | Payer: Medicaid Other | Attending: Obstetrics & Gynecology | Admitting: Obstetrics & Gynecology

## 2019-05-31 ENCOUNTER — Inpatient Hospital Stay (HOSPITAL_COMMUNITY): Payer: Medicaid Other | Admitting: Anesthesiology

## 2019-05-31 ENCOUNTER — Encounter (HOSPITAL_COMMUNITY): Payer: Self-pay | Admitting: *Deleted

## 2019-05-31 ENCOUNTER — Other Ambulatory Visit: Payer: Self-pay

## 2019-05-31 ENCOUNTER — Inpatient Hospital Stay (HOSPITAL_COMMUNITY): Payer: Medicaid Other

## 2019-05-31 DIAGNOSIS — Z20828 Contact with and (suspected) exposure to other viral communicable diseases: Secondary | ICD-10-CM | POA: Diagnosis present

## 2019-05-31 DIAGNOSIS — E669 Obesity, unspecified: Secondary | ICD-10-CM | POA: Diagnosis present

## 2019-05-31 DIAGNOSIS — K219 Gastro-esophageal reflux disease without esophagitis: Secondary | ICD-10-CM | POA: Diagnosis present

## 2019-05-31 DIAGNOSIS — O26893 Other specified pregnancy related conditions, third trimester: Secondary | ICD-10-CM | POA: Diagnosis present

## 2019-05-31 DIAGNOSIS — Z3A40 40 weeks gestation of pregnancy: Secondary | ICD-10-CM

## 2019-05-31 DIAGNOSIS — O9902 Anemia complicating childbirth: Secondary | ICD-10-CM | POA: Diagnosis present

## 2019-05-31 DIAGNOSIS — D649 Anemia, unspecified: Secondary | ICD-10-CM | POA: Diagnosis not present

## 2019-05-31 DIAGNOSIS — O99214 Obesity complicating childbirth: Secondary | ICD-10-CM | POA: Diagnosis present

## 2019-05-31 DIAGNOSIS — O9962 Diseases of the digestive system complicating childbirth: Secondary | ICD-10-CM | POA: Diagnosis present

## 2019-05-31 DIAGNOSIS — O368330 Maternal care for abnormalities of the fetal heart rate or rhythm, third trimester, not applicable or unspecified: Secondary | ICD-10-CM

## 2019-05-31 DIAGNOSIS — O480 Post-term pregnancy: Secondary | ICD-10-CM

## 2019-05-31 LAB — CBC
HCT: 34.8 % — ABNORMAL LOW (ref 36.0–46.0)
Hemoglobin: 11.1 g/dL — ABNORMAL LOW (ref 12.0–15.0)
MCH: 30.2 pg (ref 26.0–34.0)
MCHC: 31.9 g/dL (ref 30.0–36.0)
MCV: 94.8 fL (ref 80.0–100.0)
Platelets: 221 10*3/uL (ref 150–400)
RBC: 3.67 MIL/uL — ABNORMAL LOW (ref 3.87–5.11)
RDW: 14 % (ref 11.5–15.5)
WBC: 14.2 10*3/uL — ABNORMAL HIGH (ref 4.0–10.5)
nRBC: 0 % (ref 0.0–0.2)

## 2019-05-31 LAB — SARS CORONAVIRUS 2 BY RT PCR (HOSPITAL ORDER, PERFORMED IN ~~LOC~~ HOSPITAL LAB): SARS Coronavirus 2: NEGATIVE

## 2019-05-31 LAB — ABO/RH: ABO/RH(D): A POS

## 2019-05-31 LAB — TYPE AND SCREEN
ABO/RH(D): A POS
Antibody Screen: NEGATIVE

## 2019-05-31 MED ORDER — BUTORPHANOL TARTRATE 1 MG/ML IJ SOLN: 1.0000 mg | INTRAMUSCULAR | Status: DC | PRN

## 2019-05-31 MED ORDER — LACTATED RINGERS IV SOLN
INTRAVENOUS | Status: DC
  Administered 2019-05-31 (×4): via INTRAVENOUS

## 2019-05-31 MED ORDER — EPHEDRINE 5 MG/ML INJ: 10.0000 mg | INTRAVENOUS | Status: DC | PRN

## 2019-05-31 MED ORDER — SOD CITRATE-CITRIC ACID 500-334 MG/5ML PO SOLN: 30.0000 mL | ORAL | Status: DC | PRN

## 2019-05-31 MED ORDER — SODIUM CHLORIDE (PF) 0.9 % IJ SOLN
INTRAMUSCULAR | Status: DC | PRN
  Administered 2019-05-31: 10 mL/h via EPIDURAL

## 2019-05-31 MED ORDER — LIDOCAINE HCL (PF) 1 % IJ SOLN
INTRAMUSCULAR | Status: DC | PRN
  Administered 2019-05-31 (×2): 4 mL via EPIDURAL

## 2019-05-31 MED ORDER — OXYCODONE-ACETAMINOPHEN 5-325 MG PO TABS: 2.0000 | ORAL_TABLET | ORAL | Status: DC | PRN

## 2019-05-31 MED ORDER — LACTATED RINGERS IV SOLN: 500.0000 mL | Freq: Once | INTRAVENOUS | Status: DC

## 2019-05-31 MED ORDER — OXYCODONE-ACETAMINOPHEN 5-325 MG PO TABS: 1.0000 | ORAL_TABLET | ORAL | Status: DC | PRN

## 2019-05-31 MED ORDER — PHENYLEPHRINE 40 MCG/ML (10ML) SYRINGE FOR IV PUSH (FOR BLOOD PRESSURE SUPPORT): 80.0000 ug | PREFILLED_SYRINGE | INTRAVENOUS | Status: DC | PRN

## 2019-05-31 MED ORDER — OXYTOCIN BOLUS FROM INFUSION
500.0000 mL | Freq: Once | INTRAVENOUS | Status: AC
  Administered 2019-05-31: 500 mL via INTRAVENOUS

## 2019-05-31 MED ORDER — LACTATED RINGERS IV SOLN
500.0000 mL | Freq: Once | INTRAVENOUS | Status: AC
  Administered 2019-05-31: 500 mL via INTRAVENOUS

## 2019-05-31 MED ORDER — ACETAMINOPHEN 325 MG PO TABS: 650.0000 mg | ORAL_TABLET | ORAL | Status: DC | PRN

## 2019-05-31 MED ORDER — FENTANYL-BUPIVACAINE-NACL 0.5-0.125-0.9 MG/250ML-% EP SOLN
12.0000 mL/h | EPIDURAL | Status: DC | PRN
  Filled 2019-05-31: qty 250

## 2019-05-31 MED ORDER — LIDOCAINE HCL (PF) 1 % IJ SOLN: 30.0000 mL | INTRAMUSCULAR | Status: DC | PRN

## 2019-05-31 MED ORDER — OXYTOCIN 40 UNITS IN NORMAL SALINE INFUSION - SIMPLE MED
2.5000 [IU]/h | INTRAVENOUS | Status: DC
  Administered 2019-06-01: 2.5 [IU]/h via INTRAVENOUS

## 2019-05-31 MED ORDER — OXYTOCIN 40 UNITS IN NORMAL SALINE INFUSION - SIMPLE MED
1.0000 m[IU]/min | INTRAVENOUS | Status: DC
  Administered 2019-05-31: 2 m[IU]/min via INTRAVENOUS
  Filled 2019-05-31: qty 1000

## 2019-05-31 MED ORDER — DIPHENHYDRAMINE HCL 50 MG/ML IJ SOLN: 12.5000 mg | INTRAMUSCULAR | Status: DC | PRN

## 2019-05-31 MED ORDER — LACTATED RINGERS IV SOLN: 500.0000 mL | INTRAVENOUS | Status: DC | PRN

## 2019-05-31 MED ORDER — TERBUTALINE SULFATE 1 MG/ML IJ SOLN: 0.2500 mg | Freq: Once | INTRAMUSCULAR | Status: DC | PRN

## 2019-05-31 MED ORDER — ONDANSETRON HCL 4 MG/2ML IJ SOLN
4.0000 mg | Freq: Four times a day (QID) | INTRAMUSCULAR | Status: DC | PRN
  Administered 2019-05-31: 4 mg via INTRAVENOUS
  Filled 2019-05-31: qty 2

## 2019-05-31 NOTE — MAU Note (Signed)
Around 0430, woke up with some intense contractions.  Around 0730 lost the rest of mucous plug.  Since then contractions have been intense and consistent, any where from 2-10 in.  Feeling pressure in her back. Was 2 cm yesterday, membranes stripped

## 2019-05-31 NOTE — Anesthesia Procedure Notes (Signed)
Epidural Patient location during procedure: OB Start time: 05/31/2019 7:23 PM End time: 05/31/2019 7:26 PM  Staffing Anesthesiologist: Brennan Bailey, MD Performed: anesthesiologist   Preanesthetic Checklist Completed: patient identified, pre-op evaluation, timeout performed, IV checked, risks and benefits discussed and monitors and equipment checked  Epidural Patient position: sitting Prep: site prepped and draped and DuraPrep Patient monitoring: continuous pulse ox, blood pressure and heart rate Approach: midline Location: L4-L5 Injection technique: LOR air  Needle:  Needle type: Tuohy  Needle gauge: 17 G Needle length: 9 cm Catheter type: closed end flexible Catheter size: 19 Gauge Catheter at skin depth: 11 cm Test dose: negative and Other (1% lidocaine)  Assessment Events: blood not aspirated, injection not painful, no injection resistance, negative IV test and no paresthesia  Additional Notes Patient identified. Risks, benefits, and alternatives discussed with patient including but not limited to bleeding, infection, nerve damage, paralysis, failed block, incomplete pain control, headache, blood pressure changes, nausea, vomiting, reactions to medication, itching, and postpartum back pain. Confirmed with bedside nurse the patient's most recent platelet count. Confirmed with patient that they are not currently taking any anticoagulation, have any bleeding history, or any family history of bleeding disorders. Patient expressed understanding and wished to proceed. All questions were answered. Sterile technique was used throughout the entire procedure. Please see nursing notes for vital signs.   Crisp LOR on first pass. Test dose was given through epidural catheter and negative prior to continuing to dose epidural or start infusion. Warning signs of high block given to the patient including shortness of breath, tingling/numbness in hands, complete motor block, or any concerning  symptoms with instructions to call for help. Patient was given instructions on fall risk and not to get out of bed. All questions and concerns addressed with instructions to call with any issues or inadequate analgesia.  Reason for block:procedure for pain

## 2019-05-31 NOTE — Anesthesia Preprocedure Evaluation (Signed)
Anesthesia Evaluation  Patient identified by MRN, date of birth, ID band Patient awake    Reviewed: Allergy & Precautions, Patient's Chart, lab work & pertinent test results  History of Anesthesia Complications (+) PONV and history of anesthetic complications  Airway Mallampati: II  TM Distance: >3 FB Neck ROM: Full    Dental  (+) Teeth Intact   Pulmonary neg pulmonary ROS,    Pulmonary exam normal breath sounds clear to auscultation       Cardiovascular negative cardio ROS Normal cardiovascular exam Rhythm:Regular Rate:Normal     Neuro/Psych PSYCHIATRIC DISORDERS Anxiety Depression negative neurological ROS     GI/Hepatic Neg liver ROS, GERD  Medicated and Controlled,  Endo/Other  Obesity  Renal/GU negative Renal ROS  negative genitourinary   Musculoskeletal negative musculoskeletal ROS (+)   Abdominal (+) + obese,   Peds  Hematology  (+) anemia ,   Anesthesia Other Findings   Reproductive/Obstetrics (+) Pregnancy                             Anesthesia Physical Anesthesia Plan  ASA: II  Anesthesia Plan: Epidural   Post-op Pain Management:    Induction:   PONV Risk Score and Plan:   Airway Management Planned: Natural Airway  Additional Equipment:   Intra-op Plan:   Post-operative Plan:   Informed Consent: I have reviewed the patients History and Physical, chart, labs and discussed the procedure including the risks, benefits and alternatives for the proposed anesthesia with the patient or authorized representative who has indicated his/her understanding and acceptance.       Plan Discussed with: Anesthesiologist  Anesthesia Plan Comments:         Anesthesia Quick Evaluation

## 2019-05-31 NOTE — Progress Notes (Signed)
Subjective: Patient comfortable after epidural. SROM mod mec fluid  Objective: BP 126/75   Pulse 70   Temp 98.2 F (36.8 C) (Oral)   Resp 18   Ht 4\' 11"  (1.499 m)   Wt 79.8 kg   LMP  (LMP Unknown)   SpO2 100%   BMI 35.55 kg/m  No intake/output data recorded. No intake/output data recorded.  FHT: Category 1 UC:   regular, every 2-4 minutes SVE:   Dilation: 6 Effacement (%): 100 Station: 0 Exam by:: amber pope, rn  Pitocin at 6 mu   Assessment:  23 yo G1P0 at 40.1 IUP in active labor Reassuring fetal status Mec stained fluid  Plan: Monitor progress NICU at delivery Anticipate SVD  [place], MSN 05/31/2019, 8:33 PM

## 2019-05-31 NOTE — H&P (Signed)
HPI: 23 y/o G1P0 @ [redacted]w[redacted]d estimated gestational age (as dated by LMP c/w 20 week ultrasound) initially presented for labor check- on fetal monitoring- minimal variability noted and late decel x 2.  BPP completed 6/8.  Pt admitted for IOL due to fetal indication.   no Leaking of Fluid,   Some light spotting with her mucus plug,   + Uterine Contractions,  + Fetal Movement.  Prenatal care has been provided by Dr. Nelda Marseille  ROS: no HA, no epigastric pain, no visual changes.    Pregnancy complicated by: 1) acid reflux- on pepcid daily Last [place] @ 38wk- vertex/post/EFW 7#7oz (78%)   Prenatal Transfer Tool  Maternal Diabetes: No Genetic Screening: Normal Maternal Ultrasounds/Referrals: Normal Fetal Ultrasounds or other Referrals:  None Maternal Substance Abuse:  No Significant Maternal Medications:  Meds include: Other: pepcid Significant Maternal Lab Results: Group B Strep negative   PNL:  GBS negative, Rub Immune, Hep B neg, RPR NR, HIV neg, GC/C neg, glucola:137 Hgb: 11.3 Blood type: A positive, antibody neg  Immunizations: Tdap: 03/22/2019 Flu: declined  OBHx: primip PMHx:  H.o Chlamydia- 2015, negative during pregnancy Meds:  PNV, Pepcid Allergy:  No Known Allergies SurgHx: none SocHx:   no Tobacco, no  EtOH, no Illicit Drugs  O: BP 983/38 (BP Location: Left Arm)   Pulse 90   Temp 98.1 F (36.7 C) (Oral)   Resp 17   Ht 4\' 11"  (1.499 m)   Wt 79.8 kg   LMP  (LMP Unknown)   SpO2 100%   BMI 35.55 kg/m  Gen. AAOx3, NAD Resp. Normal respiratory rate and effort Abd. Gravid,  no tenderness,  no rigidity,  no guarding Extr.  no edema B/L , no calf tenderness FHT: 150 baseline, moderate variability, + accels,  no decels Toco: q 7 min SVE: 3/70/-3, palpable bag, vertex   Labs:  Results for orders placed or performed during the hospital encounter of 05/31/19 (from the past 24 hour(s))  CBC     Status: Abnormal   Collection Time: 05/31/19  4:57 PM  Result Value Ref Range   WBC  14.2 (H) 4.0 - 10.5 K/uL   RBC 3.67 (L) 3.87 - 5.11 MIL/uL   Hemoglobin 11.1 (L) 12.0 - 15.0 g/dL   HCT 34.8 (L) 36.0 - 46.0 %   MCV 94.8 80.0 - 100.0 fL   MCH 30.2 26.0 - 34.0 pg   MCHC 31.9 30.0 - 36.0 g/dL   RDW 14.0 11.5 - 15.5 %   Platelets 221 150 - 400 K/uL   nRBC 0.0 0.0 - 0.2 %  Type and screen Leslie     Status: None (Preliminary result)   Collection Time: 05/31/19  4:57 PM  Result Value Ref Range   ABO/RH(D) A POS    Antibody Screen PENDING    Sample Expiration      06/03/2019,2359 Performed at [facility], [place] [address] [place], [place] [address]     A/P:  23 y.o. G1P0 @ [redacted]w[redacted]d EGA who presents for IOL due to non-reassuring fetal assessment- BPP 6/8 -FWB:  NICHD Cat I FHTs -Labor: plan for IOL with pitocin -GBS: negative -Pain management: IV or epidural upon request  CCOB to cover 7p-7a  Janyth Pupa, DO [phone] (cell) [phone] (office)

## 2019-06-01 ENCOUNTER — Encounter (HOSPITAL_COMMUNITY): Payer: Self-pay

## 2019-06-01 LAB — CBC
HCT: 33.5 % — ABNORMAL LOW (ref 36.0–46.0)
Hemoglobin: 11.2 g/dL — ABNORMAL LOW (ref 12.0–15.0)
MCH: 31.2 pg (ref 26.0–34.0)
MCHC: 33.4 g/dL (ref 30.0–36.0)
MCV: 93.3 fL (ref 80.0–100.0)
Platelets: 214 10*3/uL (ref 150–400)
RBC: 3.59 MIL/uL — ABNORMAL LOW (ref 3.87–5.11)
RDW: 14 % (ref 11.5–15.5)
WBC: 20.5 10*3/uL — ABNORMAL HIGH (ref 4.0–10.5)
nRBC: 0 % (ref 0.0–0.2)

## 2019-06-01 LAB — RPR: RPR Ser Ql: NONREACTIVE

## 2019-06-01 MED ORDER — TETANUS-DIPHTH-ACELL PERTUSSIS 5-2.5-18.5 LF-MCG/0.5 IM SUSP: 0.5000 mL | Freq: Once | INTRAMUSCULAR | Status: DC

## 2019-06-01 MED ORDER — BENZOCAINE-MENTHOL 20-0.5 % EX AERO
1.0000 "application " | INHALATION_SPRAY | CUTANEOUS | Status: DC | PRN
  Administered 2019-06-02: 1 via TOPICAL
  Filled 2019-06-01: qty 56

## 2019-06-01 MED ORDER — COCONUT OIL OIL: 1.0000 "application " | TOPICAL_OIL | Status: DC | PRN

## 2019-06-01 MED ORDER — SIMETHICONE 80 MG PO CHEW: 80.0000 mg | CHEWABLE_TABLET | ORAL | Status: DC | PRN

## 2019-06-01 MED ORDER — ZOLPIDEM TARTRATE 5 MG PO TABS: 5.0000 mg | ORAL_TABLET | Freq: Every evening | ORAL | Status: DC | PRN

## 2019-06-01 MED ORDER — DIPHENHYDRAMINE HCL 25 MG PO CAPS: 25.0000 mg | ORAL_CAPSULE | Freq: Four times a day (QID) | ORAL | Status: DC | PRN

## 2019-06-01 MED ORDER — DIBUCAINE (PERIANAL) 1 % EX OINT
1.0000 "application " | TOPICAL_OINTMENT | CUTANEOUS | Status: DC | PRN
  Administered 2019-06-02: 1 via RECTAL
  Filled 2019-06-01: qty 28

## 2019-06-01 MED ORDER — SENNOSIDES-DOCUSATE SODIUM 8.6-50 MG PO TABS
2.0000 | ORAL_TABLET | ORAL | Status: DC
  Administered 2019-06-01: 2 via ORAL
  Filled 2019-06-01: qty 2

## 2019-06-01 MED ORDER — IBUPROFEN 600 MG PO TABS
600.0000 mg | ORAL_TABLET | Freq: Four times a day (QID) | ORAL | Status: DC
  Administered 2019-06-01 – 2019-06-02 (×4): 600 mg via ORAL
  Filled 2019-06-01 (×5): qty 1

## 2019-06-01 MED ORDER — PRENATAL MULTIVITAMIN CH
1.0000 | ORAL_TABLET | Freq: Every day | ORAL | Status: DC
  Administered 2019-06-01 – 2019-06-02 (×2): 1 via ORAL
  Filled 2019-06-01 (×2): qty 1

## 2019-06-01 MED ORDER — ACETAMINOPHEN 325 MG PO TABS: 650.0000 mg | ORAL_TABLET | ORAL | Status: DC | PRN

## 2019-06-01 MED ORDER — WITCH HAZEL-GLYCERIN EX PADS: 1.0000 "application " | MEDICATED_PAD | CUTANEOUS | Status: DC | PRN

## 2019-06-01 MED ORDER — ONDANSETRON HCL 4 MG PO TABS: 4.0000 mg | ORAL_TABLET | ORAL | Status: DC | PRN

## 2019-06-01 MED ORDER — ONDANSETRON HCL 4 MG/2ML IJ SOLN: 4.0000 mg | INTRAMUSCULAR | Status: DC | PRN

## 2019-06-01 NOTE — Anesthesia Postprocedure Evaluation (Signed)
Anesthesia Post Note  Patient: Candice Farrell  Procedure(s) Performed: AN AD HOC LABOR EPIDURAL     Patient location during evaluation: Mother Baby Anesthesia Type: Epidural Level of consciousness: awake and alert Pain management: pain level controlled Vital Signs Assessment: post-procedure vital signs reviewed and stable Respiratory status: spontaneous breathing, nonlabored ventilation and respiratory function stable Cardiovascular status: stable Postop Assessment: no headache, no backache and epidural receding Anesthetic complications: no    Last Vitals:  Vitals:   06/01/19 0256 06/01/19 0552  BP: (!) 136/92 125/83  Pulse: 76 77  Resp: 20 18  Temp: 36.8 C 36.9 C  SpO2: 98% 99%    Last Pain:  Vitals:   06/01/19 0705  TempSrc:   PainSc: 0-No pain   Pain Goal: Patients Stated Pain Goal: 7 (05/31/19 1859)                 Barkley Boards

## 2019-06-01 NOTE — Progress Notes (Signed)
MOB was referred for history of depression/anxiety. * Referral screened out by Clinical Social Worker because none of the following criteria appear to apply: ~ History of anxiety/depression during this pregnancy, or of post-partum depression following prior delivery. ~ Diagnosis of anxiety and/or depression within last 3 years. NO concerns noted in [facility] record.  OR * MOB's symptoms currently being treated with medication and/or therapy.  Please contact the Clinical Social Worker if needs arise, by [facility] request, or if MOB scores greater than 9/yes to question 10 on Edinburgh Postpartum Depression Screen.  Laurey Arrow, MSW, LCSW Clinical Social Work [phone]

## 2019-06-01 NOTE — Lactation Note (Signed)
This note was copied from a baby's chart. Lactation Consultation Note:  P1, infant is 30 hours old. Mother is doing STS. She reports that infant has had 2 feedings.  Mother reports that her staff nurse taught her to hand express and she is seeing colostrum.  Mother prefers to wait until infant is cuing before she feeds again. Advised mother to page for [facility] to see latch.  Reviewed lots of basic breastfeeding and mother ask lots of questions.   Discussed cue base feeding , STS with mother and father. Discussed cluster feeding .  Lactation brochure given with information on all [facility] services.  Patient Name: Girl Rileyann Florance [id number] Date: 06/01/2019 Reason for consult: Initial assessment   Maternal Data    Feeding    LATCH Score                   Interventions Interventions: Assisted with latch;Skin to skin  Lactation Tools Discussed/Used     Consult Status Consult Status: Follow-up Date: 06/02/19 Follow-up type: In-patient    Jess Barters [facility] 06/01/2019, 1:13 PM

## 2019-06-01 NOTE — Progress Notes (Signed)
Postpartum Note Day # 1  S:  Patient resting comfortable in bed.  Pain controlled.  Tolerating general diet. No flatus, no BM.  Lochia minimal.  Ambulating without difficulty.  She denies n/v/f/c, SOB, or CP.  Pt plans on breastfeeding.  O: Temp:  [98 F (36.7 C)-98.7 F (37.1 C)] 98.5 F (36.9 C) (09/18 0552) Pulse Rate:  [64-146] 77 (09/18 0552) Resp:  [17-20] 18 (09/18 0552) BP: (115-147)/(60-108) 125/83 (09/18 0552) SpO2:  [98 %-100 %] 99 % (09/18 0552) Weight:  [79.8 kg] 79.8 kg (09/17 1800)   Gen: A&Ox3, NAD Resp: normal respiratory rate and effort Abdomen: soft, NT, ND Uterus: firm, non-tender, below umbilicus Ext: No edema, no calf tenderness bilaterally, SCDs in place  Labs:  Recent Labs    05/31/19 1657 06/01/19 0525  HGB 11.1* 11.2*    A/P: Pt is a 23 y.o. G1P1001 s/p NSVD, PPD#1  - Pain well controlled -GU: voiding freely -GI: Tolerating general diet -Activity: encouraged sitting up to chair and ambulation as tolerated -Prophylaxis: early ambulation -Labs: stable as above  Continue with routine postpartum care, plan for discharge home tomorrow  Janyth Pupa, DO [phone] (cell) [phone] (office)

## 2019-06-02 ENCOUNTER — Other Ambulatory Visit (HOSPITAL_COMMUNITY): Admission: RE | Admit: 2019-06-02 | Payer: Medicaid Other | Source: Ambulatory Visit

## 2019-06-02 MED ORDER — IBUPROFEN 600 MG PO TABS: 600.0000 mg | ORAL_TABLET | Freq: Four times a day (QID) | ORAL | 0 refills | Status: DC

## 2019-06-02 MED ORDER — PRENATAL MULTIVITAMIN CH: 1.0000 | ORAL_TABLET | Freq: Every day | ORAL | Status: DC

## 2019-06-02 NOTE — Lactation Note (Signed)
This note was copied from a baby's chart. Lactation Consultation Note  Patient Name: Girl Rielly Brunn [id number] Date: 06/02/2019 Reason for consult: Follow-up assessment Baby is 33 hours old/4% weight loss.  Mom reports feedings are going well.  Reviewed positioning for football hold, waking techniques and breast massage.  Discussed milk coming to volume and the prevention and treatment of engorgement.  Mom has a manual pump and instructed in use, cleaning and EBM storage.  Reviewed outpatient services and support and encouraged to call prn.  Maternal Data    Feeding Feeding Type: Breast Fed  LATCH Score                   Interventions    Lactation Tools Discussed/Used     Consult Status Consult Status: Complete Follow-up type: Call as needed    Ave Filter 06/02/2019, 9:09 AM

## 2019-06-02 NOTE — Discharge Summary (Signed)
OB Discharge Summary     Patient Name: Candice Farrell DOB: [birth year] MRN: [id number]  Date of admission: 05/31/2019 Delivering MD: Kenney HousemanPROTHERO, NANCY JEAN   Date of discharge: 06/02/2019  Admitting diagnosis: CTX 5 mins Intrauterine pregnancy: 7453w1d     Secondary diagnosis:  Principal Problem:   SVD (9/17) Active Problems:   Obstetric labial laceration, delivered, current hospitalization  Additional problems:  Patient Active Problem List   Diagnosis Date Noted  . Obstetric labial laceration, delivered, current hospitalization 06/02/2019  . SVD (9/17) 05/31/2019  . DUB (dysfunctional uterine bleeding) 03/10/2018  . MDD (major depressive disorder), recurrent episode, moderate (HCC) 03/25/2014  . ODD (oppositional defiant disorder) 03/25/2014  . Post traumatic stress disorder (PTSD) 03/25/2014  . Cannabis abuse 03/25/2014        Discharge diagnosis: Term Pregnancy Delivered                                                                                                Post partum procedures:None  Induction: Pitocin  Complications: None  Hospital course:  Induction of Labor With Vaginal Delivery   23 y.o. yo G2P1011 at 6753w1d was admitted to the hospital 05/31/2019 for induction of labor.  Indication for induction: Fetal indication-BPP 6/8, minimal varibility.  Patient had an uncomplicated labor course as follows: Membrane Rupture Time/Date: 7:55 PM ,05/31/2019   Intrapartum Procedures: Episiotomy: None [1]                                         Lacerations:  1st degree [2];Labial [10]  Patient had delivery of a Viable  Female infant "Zazaran".  Information for the patient's newborn:  Jerald KiefWilliams, Girl Dorathy [[id number]][[id number]]  Delivery Method: Vaginal, Spontaneous(Filed from Delivery Summary)    05/31/2019  Details of delivery can be found in separate delivery note.  Patient had a routine postpartum course. Patient is discharged home 06/02/19.  Physical exam  Vitals:   06/01/19 0552  06/01/19 1000 06/01/19 1441 06/01/19 2254  BP: 125/83 102/74 120/83 121/73  Pulse: 77 62 62 (!) 56  Resp: 18 20 20 18   Temp: 98.5 F (36.9 C) 98.9 F (37.2 C) 98.4 F (36.9 C) 97.8 F (36.6 C)  TempSrc: Oral Oral Oral Oral  SpO2: 99% 100% 100%   Weight:      Height:       General: alert, cooperative and no distress Lochia: appropriate, no clots Uterine Fundus: firm Laceration: Well approximated, healing DVT Evaluation: No evidence of DVT seen on physical exam. No cords or calf tenderness. No significant calf/ankle edema. Labs: Lab Results  Component Value Date   WBC 20.5 (H) 06/01/2019   HGB 11.2 (L) 06/01/2019   HCT 33.5 (L) 06/01/2019   MCV 93.3 06/01/2019   PLT 214 06/01/2019   CMP Latest Ref Rng & Units 05/16/2018  Glucose 70 - 99 mg/dL 85  BUN 6 - 20 mg/dL 9  Creatinine 7.820.44 - 9.561.00 mg/dL 2.130.74  Sodium 086135 - 578145 mmol/L 140  Potassium 3.5 - 5.1  mmol/L 4.0  Chloride 98 - 111 mmol/L 107  CO2 22 - 32 mmol/L 26  Calcium 8.9 - 10.3 mg/dL 9.1  Total Protein 6.5 - 8.1 g/dL 7.1  Total Bilirubin 0.3 - 1.2 mg/dL 0.5  Alkaline Phos 38 - 126 U/L 49  AST 15 - 41 U/L 20  ALT 0 - 44 U/L 17    Discharge instruction: per After Visit Summary and "Baby and Me Booklet".   Diet: routine diet  Activity: Advance as tolerated. Pelvic rest for 6 weeks.   Outpatient follow up:6 weeks Follow up Appt:No future appointments. Follow up Visit:No follow-ups on file.  Postpartum contraception: Combination OCPs and plans to start after 6 wk PP visit  Newborn Data: Live born female "Zavaran"  Birth Weight: 6 lb 9.8 oz (2999 g) APGAR: 8, 9  Newborn Delivery   Birth date/time: 05/31/2019 23:29:00 Delivery type: Vaginal, Spontaneous      Baby Feeding: Breast Disposition:home with mother   06/02/2019 11:09 AM Arrie Eastern, CNM

## 2019-06-04 ENCOUNTER — Inpatient Hospital Stay (HOSPITAL_COMMUNITY): Payer: Medicaid Other

## 2019-06-04 ENCOUNTER — Inpatient Hospital Stay (HOSPITAL_COMMUNITY)
Admission: AD | Admit: 2019-06-04 | Payer: Medicaid Other | Source: Home / Self Care | Admitting: Obstetrics & Gynecology

## 2019-07-16 DIAGNOSIS — Z3202 Encounter for pregnancy test, result negative: Secondary | ICD-10-CM | POA: Diagnosis not present

## 2019-07-16 DIAGNOSIS — Z124 Encounter for screening for malignant neoplasm of cervix: Secondary | ICD-10-CM | POA: Diagnosis not present

## 2019-07-16 DIAGNOSIS — Z30017 Encounter for initial prescription of implantable subdermal contraceptive: Secondary | ICD-10-CM | POA: Diagnosis not present

## 2019-07-16 DIAGNOSIS — F419 Anxiety disorder, unspecified: Secondary | ICD-10-CM | POA: Diagnosis not present

## 2019-08-14 ENCOUNTER — Encounter (HOSPITAL_COMMUNITY): Payer: Self-pay | Admitting: Clinical

## 2019-08-14 ENCOUNTER — Other Ambulatory Visit: Payer: Self-pay | Admitting: Behavioral Health

## 2019-08-14 ENCOUNTER — Observation Stay (HOSPITAL_COMMUNITY)
Admission: RE | Admit: 2019-08-14 | Discharge: 2019-08-15 | Disposition: A | Discharge status: Other Acute Inpatient Hospital | Payer: Medicaid Other | Attending: Psychiatry | Admitting: Psychiatry

## 2019-08-14 ENCOUNTER — Other Ambulatory Visit: Payer: Self-pay

## 2019-08-14 DIAGNOSIS — F609 Personality disorder, unspecified: Secondary | ICD-10-CM | POA: Diagnosis not present

## 2019-08-14 DIAGNOSIS — Z803 Family history of malignant neoplasm of breast: Secondary | ICD-10-CM | POA: Diagnosis not present

## 2019-08-14 DIAGNOSIS — R4584 Anhedonia: Secondary | ICD-10-CM | POA: Insufficient documentation

## 2019-08-14 DIAGNOSIS — F419 Anxiety disorder, unspecified: Secondary | ICD-10-CM | POA: Insufficient documentation

## 2019-08-14 DIAGNOSIS — G4700 Insomnia, unspecified: Secondary | ICD-10-CM | POA: Insufficient documentation

## 2019-08-14 DIAGNOSIS — Z833 Family history of diabetes mellitus: Secondary | ICD-10-CM | POA: Diagnosis not present

## 2019-08-14 DIAGNOSIS — R45851 Suicidal ideations: Secondary | ICD-10-CM | POA: Diagnosis not present

## 2019-08-14 DIAGNOSIS — F4310 Post-traumatic stress disorder, unspecified: Secondary | ICD-10-CM | POA: Insufficient documentation

## 2019-08-14 DIAGNOSIS — Z8249 Family history of ischemic heart disease and other diseases of the circulatory system: Secondary | ICD-10-CM | POA: Diagnosis not present

## 2019-08-14 DIAGNOSIS — F418 Other specified anxiety disorders: Secondary | ICD-10-CM | POA: Diagnosis not present

## 2019-08-14 DIAGNOSIS — M419 Scoliosis, unspecified: Secondary | ICD-10-CM | POA: Insufficient documentation

## 2019-08-14 DIAGNOSIS — Z20828 Contact with and (suspected) exposure to other viral communicable diseases: Secondary | ICD-10-CM | POA: Diagnosis not present

## 2019-08-14 DIAGNOSIS — F329 Major depressive disorder, single episode, unspecified: Secondary | ICD-10-CM | POA: Diagnosis not present

## 2019-08-14 DIAGNOSIS — R440 Auditory hallucinations: Secondary | ICD-10-CM | POA: Diagnosis not present

## 2019-08-14 DIAGNOSIS — O99345 Other mental disorders complicating the puerperium: Secondary | ICD-10-CM | POA: Diagnosis not present

## 2019-08-14 DIAGNOSIS — Z79899 Other long term (current) drug therapy: Secondary | ICD-10-CM | POA: Insufficient documentation

## 2019-08-14 DIAGNOSIS — Z975 Presence of (intrauterine) contraceptive device: Secondary | ICD-10-CM | POA: Diagnosis not present

## 2019-08-14 DIAGNOSIS — F332 Major depressive disorder, recurrent severe without psychotic features: Secondary | ICD-10-CM

## 2019-08-14 LAB — SARS CORONAVIRUS 2 BY RT PCR (HOSPITAL ORDER, PERFORMED IN ~~LOC~~ HOSPITAL LAB): SARS Coronavirus 2: NEGATIVE

## 2019-08-14 MED ORDER — HYDROXYZINE HCL 25 MG PO TABS
25.0000 mg | ORAL_TABLET | Freq: Three times a day (TID) | ORAL | Status: DC | PRN
  Administered 2019-08-15: 04:00:00 25 mg via ORAL
  Filled 2019-08-14: qty 1
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
## 2019-08-15 ENCOUNTER — Encounter (HOSPITAL_COMMUNITY): Payer: Self-pay | Admitting: Registered Nurse

## 2019-08-15 ENCOUNTER — Inpatient Hospital Stay (HOSPITAL_COMMUNITY)
Admission: AD | Admit: 2019-08-15 | Discharge: 2019-08-17 | DRG: 776 | Disposition: A | Discharge status: Home / Self Care | Payer: Medicaid Other | Source: Intra-hospital | Attending: Psychiatry | Admitting: Psychiatry

## 2019-08-15 ENCOUNTER — Inpatient Hospital Stay (HOSPITAL_COMMUNITY): Admission: AD | Admit: 2019-08-15 | Payer: Medicaid Other | Source: Intra-hospital | Admitting: Psychiatry

## 2019-08-15 ENCOUNTER — Encounter (HOSPITAL_COMMUNITY): Payer: Self-pay

## 2019-08-15 DIAGNOSIS — Z20828 Contact with and (suspected) exposure to other viral communicable diseases: Secondary | ICD-10-CM | POA: Diagnosis not present

## 2019-08-15 DIAGNOSIS — F53 Postpartum depression: Secondary | ICD-10-CM | POA: Diagnosis present

## 2019-08-15 DIAGNOSIS — R45851 Suicidal ideations: Secondary | ICD-10-CM | POA: Diagnosis present

## 2019-08-15 DIAGNOSIS — F329 Major depressive disorder, single episode, unspecified: Secondary | ICD-10-CM | POA: Diagnosis not present

## 2019-08-15 DIAGNOSIS — F332 Major depressive disorder, recurrent severe without psychotic features: Secondary | ICD-10-CM

## 2019-08-15 DIAGNOSIS — F419 Anxiety disorder, unspecified: Secondary | ICD-10-CM | POA: Diagnosis not present

## 2019-08-15 DIAGNOSIS — O99335 Smoking (tobacco) complicating the puerperium: Secondary | ICD-10-CM | POA: Diagnosis present

## 2019-08-15 DIAGNOSIS — G4700 Insomnia, unspecified: Secondary | ICD-10-CM | POA: Diagnosis present

## 2019-08-15 DIAGNOSIS — Z803 Family history of malignant neoplasm of breast: Secondary | ICD-10-CM | POA: Diagnosis not present

## 2019-08-15 DIAGNOSIS — F17290 Nicotine dependence, other tobacco product, uncomplicated: Secondary | ICD-10-CM | POA: Diagnosis present

## 2019-08-15 DIAGNOSIS — M419 Scoliosis, unspecified: Secondary | ICD-10-CM | POA: Diagnosis not present

## 2019-08-15 DIAGNOSIS — R440 Auditory hallucinations: Secondary | ICD-10-CM | POA: Diagnosis not present

## 2019-08-15 DIAGNOSIS — F609 Personality disorder, unspecified: Secondary | ICD-10-CM | POA: Diagnosis not present

## 2019-08-15 DIAGNOSIS — O99345 Other mental disorders complicating the puerperium: Principal | ICD-10-CM | POA: Diagnosis present

## 2019-08-15 DIAGNOSIS — R4584 Anhedonia: Secondary | ICD-10-CM | POA: Diagnosis not present

## 2019-08-15 DIAGNOSIS — F4310 Post-traumatic stress disorder, unspecified: Secondary | ICD-10-CM | POA: Diagnosis not present

## 2019-08-15 DIAGNOSIS — Z79899 Other long term (current) drug therapy: Secondary | ICD-10-CM | POA: Diagnosis not present

## 2019-08-15 DIAGNOSIS — Z8249 Family history of ischemic heart disease and other diseases of the circulatory system: Secondary | ICD-10-CM | POA: Diagnosis not present

## 2019-08-15 LAB — CBC
HCT: 40.5 % (ref 36.0–46.0)
Hemoglobin: 13 g/dL (ref 12.0–15.0)
MCH: 31 pg (ref 26.0–34.0)
MCHC: 32.1 g/dL (ref 30.0–36.0)
MCV: 96.7 fL (ref 80.0–100.0)
Platelets: 210 10*3/uL (ref 150–400)
RBC: 4.19 MIL/uL (ref 3.87–5.11)
RDW: 14.3 % (ref 11.5–15.5)
WBC: 6 10*3/uL (ref 4.0–10.5)
nRBC: 0 % (ref 0.0–0.2)

## 2019-08-15 LAB — COMPREHENSIVE METABOLIC PANEL
ALT: 63 U/L — ABNORMAL HIGH (ref 0–44)
AST: 43 U/L — ABNORMAL HIGH (ref 15–41)
Albumin: 4.5 g/dL (ref 3.5–5.0)
Alkaline Phosphatase: 50 U/L (ref 38–126)
Anion gap: 9 (ref 5–15)
BUN: 9 mg/dL (ref 6–20)
CO2: 22 mmol/L (ref 22–32)
Calcium: 9.2 mg/dL (ref 8.9–10.3)
Chloride: 107 mmol/L (ref 98–111)
Creatinine, Ser: 0.69 mg/dL (ref 0.44–1.00)
GFR calc Af Amer: >60 mL/min (ref >60)
GFR calc non Af Amer: >60 mL/min (ref >60)
Glucose, Bld: 85 mg/dL (ref 70–99)
Potassium: 4.1 mmol/L (ref 3.5–5.1)
Sodium: 138 mmol/L (ref 135–145)
Total Bilirubin: 0.7 mg/dL (ref 0.3–1.2)
Total Protein: 7.3 g/dL (ref 6.5–8.1)

## 2019-08-15 LAB — HEMOGLOBIN A1C
Hgb A1c MFr Bld: 5.5 % (ref 4.8–5.6)
Mean Plasma Glucose: 111.15 mg/dL

## 2019-08-15 LAB — LIPID PANEL
Cholesterol: 157 mg/dL (ref 0–200)
HDL: 48 mg/dL (ref >40)
LDL Cholesterol: 102 mg/dL — ABNORMAL HIGH (ref 0–99)
Total CHOL/HDL Ratio: 3.3 RATIO
Triglycerides: 33 mg/dL (ref <150)
VLDL: 7 mg/dL (ref 0–40)

## 2019-08-15 LAB — ETHANOL: Alcohol, Ethyl (B): <10 mg/dL (ref <10)

## 2019-08-15 LAB — TSH: TSH: 1.043 u[IU]/mL (ref 0.350–4.500)

## 2019-08-15 MED ORDER — ALUM & MAG HYDROXIDE-SIMETH 200-200-20 MG/5ML PO SUSP: 30.0000 mL | ORAL | Status: DC | PRN

## 2019-08-15 MED ORDER — ACETAMINOPHEN 325 MG PO TABS: 650.0000 mg | ORAL_TABLET | Freq: Four times a day (QID) | ORAL | Status: DC | PRN

## 2019-08-15 MED ORDER — MAGNESIUM HYDROXIDE 400 MG/5ML PO SUSP: 30.0000 mL | Freq: Every day | ORAL | Status: DC | PRN

## 2019-08-15 MED ORDER — BUPROPION HCL ER (XL) 150 MG PO TB24
150.0000 mg | ORAL_TABLET | Freq: Every day | ORAL | Status: DC
  Administered 2019-08-16 – 2019-08-17 (×2): 150 mg via ORAL
  Filled 2019-08-15 (×3): qty 1

## 2019-08-15 MED ORDER — BUPROPION HCL ER (XL) 150 MG PO TB24
150.0000 mg | ORAL_TABLET | Freq: Every day | ORAL | Status: DC
  Administered 2019-08-15: 150 mg via ORAL
  Filled 2019-08-15: qty 1

## 2019-08-15 MED ORDER — HYDROXYZINE HCL 25 MG PO TABS
25.0000 mg | ORAL_TABLET | Freq: Three times a day (TID) | ORAL | Status: DC | PRN
  Administered 2019-08-15 – 2019-08-16 (×2): 25 mg via ORAL
  Filled 2019-08-15: qty 10
  Filled 2019-08-15 (×2): qty 1

## 2019-08-15 MED ORDER — TRAZODONE HCL 50 MG PO TABS
50.0000 mg | ORAL_TABLET | Freq: Every evening | ORAL | Status: DC | PRN
  Administered 2019-08-15 – 2019-08-16 (×2): 50 mg via ORAL
  Filled 2019-08-15: qty 7
  Filled 2019-08-15 (×2): qty 1

## 2019-08-15 MED ORDER — CLONIDINE HCL 0.1 MG PO TABS
0.1000 mg | ORAL_TABLET | Freq: Every day | ORAL | Status: DC
  Filled 2019-08-15 (×2): qty 1

## 2019-08-15 NOTE — Progress Notes (Signed)
Patient ID: Candice Farrell, female   DOB: [birth year], 23 y.o.   MRN: [id number] Pt lying in bed resting quietly in bed awake at the beginning of this shift.. No apparent distress noted or complaints voiced. Pt is alert and oriented x 3. Pt has passive suicidal ideations with out a plan. Pt denies HI and AVH. Pt was able to contract for safety. Will continue to monitor q 15 mons for safety.
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
## 2019-08-15 NOTE — BH Assessment (Signed)
[place] Assessment Progress Note  Per Shuvon Rankin, FNP, this voluntary pt requires psychiatric hospitalization at this time.  Nonah Mattes, RN has assigned pt to [facility] Rm 306-2.  Pt's nurse, Jan, has been notified.  Jalene Mullet, [place] Coordinator [phone]

## 2019-08-15 NOTE — Progress Notes (Signed)
Pt being transferred to the adult unit. All items returned.

## 2019-08-15 NOTE — Progress Notes (Signed)
Pt very argumentative , pt stated she was lied to , pt stated she has not seen a doctor. Pt stated she was told she could leave anytime she wanted .
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
## 2019-08-15 NOTE — Tx Team (Signed)
Initial Treatment Plan 08/15/2019 2:27 PM Eylin Hilmes ZMO:[id number]    PATIENT STRESSORS: Health problems Medication change or noncompliance   PATIENT STRENGTHS: Ability for insight Active sense of humor Average or above average intelligence Communication skills Motivation for treatment/growth Physical Health Supportive family/friends Work skills   PATIENT IDENTIFIED PROBLEMS: depression  anxiety  Suicidal ideations  "overwhelmed"               DISCHARGE CRITERIA:  Ability to meet basic life and health needs Improved stabilization in mood, thinking, and/or behavior Motivation to continue treatment in a less acute level of care Need for constant or close observation no longer present  PRELIMINARY DISCHARGE PLAN: Attend aftercare/continuing care group Outpatient therapy Return to previous living arrangement Return to previous work or school arrangements  PATIENT/FAMILY INVOLVEMENT: This treatment plan has been presented to and reviewed with the patient, Linnell Swords.  The patient and family have been given the opportunity to ask questions and make suggestions.  Baron Sane, RN 08/15/2019, 2:27 PM

## 2019-08-16 NOTE — BHH Suicide Risk Assessment (Signed)
[facility] INPATIENT:  Family/Significant Other Suicide Prevention Education  Suicide Prevention Education:  Contact Attempts:  Alyson Reedy, husband, [phone], has been identified by the patient as the family member/significant other with whom the patient will be residing, and identified as the person(s) who will aid the patient in the event of a mental health crisis.  With written consent from the patient, two attempts were made to provide suicide prevention education, prior to and/or following the patient's discharge.  We were unsuccessful in providing suicide prevention education.  A suicide education pamphlet was given to the patient to share with family/significant other.  Date and time of first attempt: 08/16/2019 at 2:30PM Date and time of second attempt: Second attempt is needed.  CSW left HIPAA compliant voicemail.   Rozann Lesches 08/16/2019, 2:30 PM

## 2019-08-16 NOTE — BHH Group Notes (Signed)
Adult Psychoeducational Group Note  Date:  08/16/2019 Time:  10:27 PM  Group Topic/Focus:  Wrap-Up Group:   The focus of this group is to help patients review their daily goal of treatment and discuss progress on daily workbooks.  Participation Level:  Active  Participation Quality:  Intrusive  Affect:  Anxious and Blunted  Cognitive:  Appropriate  Insight: Good  Engagement in Group:  Distracting and Off Topic  Modes of Intervention:  Discussion and Education  Additional Comments:  Pt attended and participated in wrap up group this evening and rated their day a 9/10. Pt saw their husband today and will be discharged tomorrow. Pt completed their goal, which was to manage their medications.   Candice Farrell 08/16/2019, 10:27 PM
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
## 2019-08-16 NOTE — Progress Notes (Signed)
D:  Patient's self inventory sheet, patient sleeps good, sleep medication helpful.  Good appetite, normal energy level, good concentration.  Denied depression, hopeless and anxiety.  Denied withdrawals.  Denied SI.  Denied physical problems.  Denied physical pain.  Goal is continue journey of getting home to baby and husband.  Plans to participate in groups and take meds.  Does have discharge plans. A:  Medications administered per MD orders.  Emotional support and encouragement given patient. R:  Denied SI and HI, contracts for safety.  Denied visual hallucinations.  Auditory hallucinations continue.

## 2019-08-17 MED ORDER — BUPROPION HCL ER (XL) 150 MG PO TB24: 150.0000 mg | ORAL_TABLET | Freq: Every day | ORAL | 0 refills | Status: DC

## 2019-08-17 MED ORDER — TRAZODONE HCL 50 MG PO TABS: 50.0000 mg | ORAL_TABLET | Freq: Every evening | ORAL | 0 refills | Status: DC | PRN

## 2019-08-17 MED ORDER — HYDROXYZINE HCL 25 MG PO TABS: 25.0000 mg | ORAL_TABLET | Freq: Three times a day (TID) | ORAL | 0 refills | Status: DC | PRN

## 2019-08-17 NOTE — BHH Suicide Risk Assessment (Signed)
[facility] Discharge Suicide Risk Assessment   Principal Problem: <principal problem not specified> Discharge Diagnoses: Active Problems:   MDD (major depressive disorder), recurrent severe, without psychosis ([place])   Postpartum depression, postpartum condition   Total Time spent with patient: 15 minutes  Musculoskeletal: Strength & Muscle Tone: within normal limits Gait & Station: normal Patient leans: N/A  Psychiatric Specialty Exam: Review of Systems  All other systems reviewed and are negative.   Blood pressure 111/70, pulse 91, temperature 98.4 F (36.9 C), temperature source Oral, resp. rate 16, height 4\' 11"  (1.499 m), weight 67.1 kg, SpO2 99 %, unknown if currently breastfeeding.Body mass index is 29.89 kg/m.  General Appearance: Casual  Eye Contact::  Good  Speech:  Normal Rate409  Volume:  Normal  Mood:  Euthymic  Affect:  Congruent  Thought Process:  Coherent and Descriptions of Associations: Intact  Orientation:  Full (Time, Place, and Person)  Thought Content:  Logical  Suicidal Thoughts:  No  Homicidal Thoughts:  No  Memory:  Immediate;   Good Recent;   Good Remote;   Good  Judgement:  Good  Insight:  Good  Psychomotor Activity:  Normal  Concentration:  Good  Recall:  Good  Fund of Knowledge:Good  Language: Good  Akathisia:  Negative  Handed:  Right  AIMS (if indicated):     Assets:  Communication Skills Desire for Improvement Housing Resilience Social Support Talents/Skills Transportation  Sleep:  Number of Hours: 5.75  Cognition: WNL  ADL's:  Intact   Mental Status Per Nursing Assessment::   On Admission:  Suicidal ideation indicated by patient, Plan includes specific time, place, or method, Intention to act on suicide plan, Suicidal ideation indicated by others, Self-harm thoughts, Belief that plan would result in death, Suicide plan, Self-harm behaviors  Demographic Factors:  Low socioeconomic status  Loss Factors: NA  Historical  Factors: Impulsivity  Risk Reduction Factors:   Responsible for children under 58 years of age, Sense of responsibility to family, Living with another person, especially a relative, Positive social support and Positive coping skills or problem solving skills  Continued Clinical Symptoms:  Depression:   Impulsivity  Cognitive Features That Contribute To Risk:  None    Suicide Risk:  Minimal: No identifiable suicidal ideation.  Patients presenting with no risk factors but with morbid ruminations; may be classified as minimal risk based on the severity of the depressive symptoms  Follow-up Information    [facility] Follow up on 08/24/2019.   Why: Your hospital discharge appointment will be held by phone on 08/24/2019 at 11am. Your provider will call you. Please have access to your hospital discharge paperwork, current medications, and insurance information.  Contact information: [address] [place] [address] [phone]           Plan Of Care/Follow-up recommendations:  Activity:  ad lib  Sharma Covert, MD 08/17/2019, 7:27 AM

## 2019-08-17 NOTE — Plan of Care (Signed)
Discharge note  Patient verbalizes readiness for discharge. Follow up plan explained, AVS, Transition record and SRA given. Prescriptions and teaching provided. Belongings returned and signed for. Suicide safety plan completed and signed. Patient verbalizes understanding. Patient denies SI/HI and assures this Probation officer they will seek assistance should that change. Patient discharged to lobby where husband was waiting.  Problem: Education: Goal: Knowledge of  General Education information/materials will improve Outcome: Adequate for Discharge Goal: Emotional status will improve Outcome: Adequate for Discharge Goal: Mental status will improve Outcome: Adequate for Discharge Goal: Verbalization of understanding the information provided will improve Outcome: Adequate for Discharge   Problem: Activity: Goal: Interest or engagement in activities will improve Outcome: Adequate for Discharge Goal: Sleeping patterns will improve Outcome: Adequate for Discharge   Problem: Coping: Goal: Ability to verbalize frustrations and anger appropriately will improve Outcome: Adequate for Discharge Goal: Ability to demonstrate self-control will improve Outcome: Adequate for Discharge   Problem: Health Behavior/Discharge Planning: Goal: Identification of resources available to assist in meeting health care needs will improve Outcome: Adequate for Discharge Goal: Compliance with treatment plan for underlying cause of condition will improve Outcome: Adequate for Discharge   Problem: Physical Regulation: Goal: Ability to maintain clinical measurements within normal limits will improve Outcome: Adequate for Discharge   Problem: Safety: Goal: Periods of time without injury will increase Outcome: Adequate for Discharge   Problem: Education: Goal: Ability to state activities that reduce stress will improve Outcome: Adequate for Discharge   Problem: Coping: Goal: Ability to identify and develop  effective coping behavior will improve Outcome: Adequate for Discharge   Problem: Self-Concept: Goal: Ability to identify factors that promote anxiety will improve Outcome: Adequate for Discharge Goal: Level of anxiety will decrease Outcome: Adequate for Discharge Goal: Ability to modify response to factors that promote anxiety will improve Outcome: Adequate for Discharge   Problem: Education: Goal: Utilization of techniques to improve thought processes will improve Outcome: Adequate for Discharge Goal: Knowledge of the prescribed therapeutic regimen will improve Outcome: Adequate for Discharge   Problem: Activity: Goal: Interest or engagement in leisure activities will improve Outcome: Adequate for Discharge Goal: Imbalance in normal sleep/wake cycle will improve Outcome: Adequate for Discharge   Problem: Coping: Goal: Coping ability will improve Outcome: Adequate for Discharge Goal: Will verbalize feelings Outcome: Adequate for Discharge   Problem: Health Behavior/Discharge Planning: Goal: Ability to make decisions will improve Outcome: Adequate for Discharge Goal: Compliance with therapeutic regimen will improve Outcome: Adequate for Discharge   Problem: Role Relationship: Goal: Will demonstrate positive changes in social behaviors and relationships Outcome: Adequate for Discharge   Problem: Safety: Goal: Ability to disclose and discuss suicidal ideas will improve Outcome: Adequate for Discharge Goal: Ability to identify and utilize support systems that promote safety will improve Outcome: Adequate for Discharge   Problem: Self-Concept: Goal: Will verbalize positive feelings about self Outcome: Adequate for Discharge Goal: Level of anxiety will decrease Outcome: Adequate for Discharge   Problem: Activity: Goal: Will identify at least one activity in which they can participate Outcome: Adequate for Discharge   Problem: Coping: Goal: Ability to identify and  develop effective coping behavior will improve Outcome: Adequate for Discharge Goal: Ability to interact with others will improve Outcome: Adequate for Discharge Goal: Demonstration of participation in decision-making regarding own care will improve Outcome: Adequate for Discharge Goal: Ability to use eye contact when communicating with others will improve Outcome: Adequate for Discharge   Problem: Health Behavior/Discharge Planning: Goal: Identification of resources available  to assist in meeting health care needs will improve Outcome: Adequate for Discharge   Problem: Self-Concept: Goal: Will verbalize positive feelings about self Outcome: Adequate for Discharge   Problem: Education: Goal: Ability to make informed decisions regarding treatment will improve Outcome: Adequate for Discharge   Problem: Coping: Goal: Coping ability will improve Outcome: Adequate for Discharge   Problem: Health Behavior/Discharge Planning: Goal: Identification of resources available to assist in meeting health care needs will improve Outcome: Adequate for Discharge   Problem: Medication: Goal: Compliance with prescribed medication regimen will improve Outcome: Adequate for Discharge   Problem: Self-Concept: Goal: Ability to disclose and discuss suicidal ideas will improve Outcome: Adequate for Discharge Goal: Will verbalize positive feelings about self Outcome: Adequate for Discharge

## 2019-08-17 NOTE — Discharge Summary (Signed)
Physician Discharge Summary Note  Patient:  Candice Farrell is an 23 y.o., female  MRN:  [id number]  DOB:  1996/01/01  Patient phone:  [phone] (home)   Patient address:   [place] [address],   Total Time spent with patient: Greater than 30 minutes  Date of Admission:  08/15/2019  Date of Discharge: 08-17-19  Reason for Admission: Worsening symptoms of depression & suicidal ideations since given birth in September, 2020.  Principal Problem: MDD (major depressive disorder), recurrent severe, without psychosis ([place])  Discharge Diagnoses: Principal Problem:   MDD (major depressive disorder), recurrent severe, without psychosis ([place]) Active Problems:   Postpartum depression, postpartum condition  Past Psychiatric History: Major depressive disorder  Past Medical History:  Past Medical History:  Diagnosis Date  . Anxiety   . Back pain   . Depression   . PONV (postoperative nausea and vomiting)   . Scoliosis     Past Surgical History:  Procedure Laterality Date  . BREAST CYST EXCISION     Family History:  Family History  Problem Relation Age of Onset  . Diabetes Mother   . Deep vein thrombosis Mother   . Heart disease Father   . Breast cancer Paternal Aunt    Family Psychiatric  History: See H&P  Social History:  Social History   Substance and Sexual Activity  Alcohol Use Not Currently   Comment: "occ during holidays"     Social History   Substance and Sexual Activity  Drug Use Not Currently  . Types: Marijuana    Social History   Socioeconomic History  . Marital status: Single    Spouse name: Not on file  . Number of children: Not on file  . Years of education: Not on file  . Highest education level: Not on file  Occupational History  . Not on file  Social Needs  . Financial resource strain: Not hard at all  . Food insecurity    Worry: Never true    Inability: Never true  . Transportation needs    Medical: No     Non-medical: No  Tobacco Use  . Smoking status: Current Some Day Smoker    Packs/day: 0.25    Types: Cigars  . Smokeless tobacco: Never Used  Substance and Sexual Activity  . Alcohol use: Not Currently    Comment: "occ during holidays"  . Drug use: Not Currently    Types: Marijuana  . Sexual activity: Yes    Birth control/protection: None  Lifestyle  . Physical activity    Days per week: Not on file    Minutes per session: Not on file  . Stress: Not on file  Relationships  . Social Herbalist on phone: Not on file    Gets together: Not on file    Attends religious service: Not on file    Active member of club or organization: Not on file    Attends meetings of clubs or organizations: Not on file    Relationship status: Not on file  Other Topics Concern  . Not on file  Social History Narrative  . Not on file   Hospital Course: (Per Md's admission evaluation): Patient is a 23 year old female with a past psychiatric history significant for depression, anxiety and new onset suicidal ideation.  The patient stated that she had given birth on 05/31/2019. Since then she was placed on antidepressants, and the suicidal ideations worsened.  The patient stated that she  had also had suicidal thoughts in the past at age 23.  She does have a history of trauma in the past and endorses a history of auditory hallucinations since age 89.  She stated "I do not really pay any attention to them now.  She did admit to nightmares and flashbacks.  She presented to the behavioral health hospital on 08/14/2019 with worsening depression and some suicidal ideation.  She had been previously treated with Zoloft, Zyprexa, Abilify, and venlafaxine.  She denied any thoughts of harm to her child.  She was admitted to the hospital for evaluation and stabilization.  This is another discharge summary from this [facility] for Candice Farrell. [name] with problems related to mental health issues.  At the time, she was at the [name] unit. [facility] complaining of worsening depression & suicidal ideations since given birth this past September, 2020. After evaluation of her presenting symptoms, Candice Farrell was recommended for mood stabilization treatments. The medication regimen for her presenting symptoms were discussed & initiated with her consent. She received, stabilized & was discharged on the medications as listed on her discharge medication lists below. She presented no other significant medical issues that required treatment or monitoring. She tolerated her treatment regimen without any adverse effects or reactions reported.   Besides the mood stabilizations, Candice Farrell was also enrolled & participated in the group counseling sessions, being offered & held on this unit. She learned coping skills that should help her cope better & maintain mood stability after discharged.  Candice Farrell's symptoms responded well to her treatment regimen. This is evidenced by her reports of improved mood & absence of suicidal ideations. She is currently mentally & medically stable to be discharged to continue mental health care & medication management on an outpatient basis as noted below. She is provided with all the necessary information needed to make this appointment without problems.  Today upon her discharge evaluation with the attending psychiatrist, pt shares she is doing well. She denies any other specific concerns. She is sleeping well. Her appetite is good. She denies any other physical complaints. She denies SI/HI/AH/VH. She feels that her medications have been helpful and she is in agreement to continue her current treatment regimen. She was able to engage in safety planning including [facility] or contact emergency services if she feels unable to maintain her own safety or the safety of others. Pt had no further questions, comments, or concerns. [facility] all personal  belongings in no apparent distress. Transportation per husband.  Physical Findings: AIMS: Facial and Oral Movements Muscles of Facial Expression: None, normal Lips and Perioral Area: None, normal Jaw: None, normal Tongue: None, normal,Extremity Movements Upper (arms, wrists, hands, fingers): None, normal Lower (legs, knees, ankles, toes): None, normal, Trunk Movements Neck, shoulders, hips: None, normal, Overall Severity Severity of abnormal movements (highest score from questions above): None, normal Incapacitation due to abnormal movements: None, normal Patient's awareness of abnormal movements (rate only patient's report): No Awareness, Dental Status Current problems with teeth and/or dentures?: No Does patient usually wear dentures?: No  CIWA:    COWS:     Musculoskeletal: Strength & Muscle Tone: within normal limits Gait & Station: normal Patient leans: N/A  Psychiatric Specialty Exam: Physical Exam  Nursing note and vitals reviewed. Constitutional: She appears well-developed.  Neck: Normal range of motion.  Cardiovascular: Normal rate.  Respiratory: Effort normal.  Genitourinary:  Genitourinary Comments: Deferred   Musculoskeletal: Normal range of motion.  Neurological: She is alert.  Skin: Skin is warm.    Review of Systems  Constitutional: Negative for chills and fever.  Respiratory: Negative for cough, shortness of breath and wheezing.   Cardiovascular: Negative for chest pain and palpitations.  Gastrointestinal: Negative for abdominal pain, heartburn, nausea and vomiting.  Musculoskeletal: Negative for myalgias.  Skin: Negative.   Neurological: Negative for dizziness and headaches.  Psychiatric/Behavioral: Positive for depression (Stabilized with medication prior to discharge). Negative for hallucinations, memory loss, substance abuse and suicidal ideas. The patient has insomnia (Stabilized with medication prior to discharge). The patient is not  nervous/anxious (Stable).     Blood pressure 111/70, pulse 91, temperature 98.4 F (36.9 C), temperature source Oral, resp. rate 16, height  (1.499 m), weight 67.1 kg, SpO2 99 %, unknown if currently breastfeeding.Body mass index is 29.89 kg/m.  See Md's discharge SRA     Has this patient used any form of tobacco in the last 30 days? (Cigarettes, Smokeless Tobacco, Cigars, and/or Pipes): N/A  Blood Alcohol level:  Lab Results  Component Value Date   [facility] <10 08/15/2019   ETH <11 03/24/2014   Metabolic Disorder Labs:  Lab Results  Component Value Date   HGBA1C 5.5 08/15/2019   MPG 111.15 08/15/2019   No results found for: PROLACTIN Lab Results  Component Value Date   CHOL 157 08/15/2019   TRIG 33 08/15/2019   HDL 48 08/15/2019   CHOLHDL 3.3 08/15/2019   VLDL 7 08/15/2019   LDLCALC 102 (H) 08/15/2019   See Psychiatric Specialty Exam and Suicide Risk Assessment completed by Attending Physician prior to discharge.  Discharge destination:  Home  Is patient on multiple antipsychotic therapies at discharge:  No   Has Patient had three or more failed trials of antipsychotic monotherapy by history:  No  Recommended Plan for Multiple Antipsychotic Therapies: NA   Allergies as of 08/17/2019   No Known Allergies     Medication List    STOP taking these medications   sertraline 25 MG tablet Commonly known as: ZOLOFT     TAKE these medications     Indication  buPROPion 150 MG 24 hr tablet Commonly known as: WELLBUTRIN XL Take 1 tablet (150 mg total) by mouth daily. For depression  Indication: Major Depressive Disorder   hydrOXYzine 25 MG tablet Commonly known as: ATARAX/VISTARIL Take 1 tablet (25 mg total) by mouth 3 (three) times daily as needed for anxiety.  Indication: Feeling Anxious   traZODone 50 MG tablet Commonly known as: DESYREL Take 1 tablet (50 mg total) by mouth at bedtime as needed for sleep.  Indication: Trouble Sleeping      Follow-up  Information    [facility] Follow up on 08/24/2019.   Why: Your hospital discharge appointment will be held by phone on 08/24/2019 at 11am. Your provider will call you. Please have access to your hospital discharge paperwork, current medications, and insurance information.  Contact information: [address] [place] [address] [phone]          Follow-up recommendations: Activity:  As tolerated Diet: As recommended by your primary care doctor. Keep all scheduled follow-up appointments as recommended.   Comments: Prescriptions given at discharge.  Patient agreeable to plan.  Given opportunity to ask questions.  Appears to feel comfortable with discharge denies any current suicidal or homicidal thought. Patient is also instructed prior to discharge to: Take all medications as prescribed by his/her  mental healthcare provider. Report any adverse effects and or reactions from the medicines to his/her outpatient provider promptly. Patient has been instructed & cautioned: To not engage in alcohol and or illegal drug use while on prescription medicines. In the event of worsening symptoms, patient is instructed to call the crisis hotline, 911 and or go to the nearest ED for appropriate evaluation and treatment of symptoms. To follow-up with his/her primary care provider for your other medical issues, concerns and or health care needs.  Signed: Armandina Stammer, NP, PMHNP, FNP-BC 08/17/2019, 8:55 AM

## 2019-08-17 NOTE — Progress Notes (Signed)
  [facility] Adult Case Management Discharge Plan :  Will you be returning to the same living situation after discharge:  Yes,  patient returned home with her husband At discharge, do you have transportation home?: Yes,  patient's husband picked her up  Do you have the ability to pay for your medications: Yes,  Medicaid   Release of information consent forms completed and in the chart;  Patient's signature needed at discharge.  Patient to Follow up at: Follow-up Information    [facility] Follow up on 08/24/2019.   Why: Your hospital discharge appointment will be held by phone on 08/24/2019 at 11am. Your provider will call you. Please have access to your hospital discharge paperwork, current medications, and insurance information.  Contact information: [address] [place] [address] [phone]           Next level of care provider has access to [place] and Suicide Prevention discussed: Yes,  with the patient      Has patient been referred to the Quitline?: N/A patient is not a smoker  Patient has been referred for addiction treatment: [place] 08/17/2019, 8:54 AM

## 2019-08-17 NOTE — Progress Notes (Signed)
   08/17/19 0124  Psych Admission Type (Psych Patients Only)  Admission Status Voluntary  Psychosocial Assessment  Patient Complaints Irritability  Eye Contact Fair  Facial Expression Animated;Anxious  Affect Anxious;Depressed;Preoccupied  Speech International aid/development worker;Restless  Appearance/Hygiene Unremarkable  Behavior Characteristics Agitated  Mood Irritable  Thought Process  Coherency WDL  Content WDL  Delusions None reported or observed  Perception Hallucinations  Hallucination Auditory  Judgment Poor  Confusion None  Danger to Self  Current suicidal ideation? Denies  Agreement Not to Harm Self Yes  Description of Agreement Pt contracted for safety.  Danger to Others  Danger to Others None reported or observed  D: Patient in dayroom irritable on approach, Pt upset about an incident that happened during the day.  A: Medications administered as prescribed. Support and encouragement provided as needed.  R: Patient remains safe on the unit. Will continue to monitor for safety and stability.

## 2019-09-28 ENCOUNTER — Encounter (HOSPITAL_COMMUNITY): Payer: Self-pay | Admitting: Obstetrics and Gynecology

## 2019-12-12 ENCOUNTER — Other Ambulatory Visit (HOSPITAL_COMMUNITY): Payer: Self-pay | Admitting: Psychiatry

## 2019-12-25 ENCOUNTER — Other Ambulatory Visit (HOSPITAL_COMMUNITY): Payer: Self-pay | Admitting: Psychiatry

## 2020-01-18 DIAGNOSIS — Z975 Presence of (intrauterine) contraceptive device: Secondary | ICD-10-CM | POA: Diagnosis not present

## 2020-01-18 DIAGNOSIS — F418 Other specified anxiety disorders: Secondary | ICD-10-CM | POA: Diagnosis not present

## 2020-02-29 ENCOUNTER — Ambulatory Visit: Payer: No Typology Code available for payment source | Attending: Internal Medicine

## 2020-04-15 DIAGNOSIS — R3989 Other symptoms and signs involving the genitourinary system: Secondary | ICD-10-CM | POA: Diagnosis not present

## 2020-04-15 DIAGNOSIS — Z975 Presence of (intrauterine) contraceptive device: Secondary | ICD-10-CM | POA: Diagnosis not present

## 2020-04-15 DIAGNOSIS — F419 Anxiety disorder, unspecified: Secondary | ICD-10-CM | POA: Diagnosis not present

## 2020-04-15 DIAGNOSIS — R102 Pelvic and perineal pain: Secondary | ICD-10-CM | POA: Diagnosis not present

## 2020-04-15 DIAGNOSIS — Z8659 Personal history of other mental and behavioral disorders: Secondary | ICD-10-CM | POA: Diagnosis not present

## 2020-04-15 DIAGNOSIS — Z3202 Encounter for pregnancy test, result negative: Secondary | ICD-10-CM | POA: Diagnosis not present

## 2020-04-18 ENCOUNTER — Other Ambulatory Visit (HOSPITAL_COMMUNITY): Payer: Self-pay | Admitting: Psychiatry

## 2020-07-09 DIAGNOSIS — R102 Pelvic and perineal pain: Secondary | ICD-10-CM | POA: Diagnosis not present

## 2020-07-09 DIAGNOSIS — N898 Other specified noninflammatory disorders of vagina: Secondary | ICD-10-CM | POA: Diagnosis not present

## 2020-07-09 DIAGNOSIS — N643 Galactorrhea not associated with childbirth: Secondary | ICD-10-CM | POA: Diagnosis not present

## 2020-07-09 DIAGNOSIS — F319 Bipolar disorder, unspecified: Secondary | ICD-10-CM | POA: Diagnosis not present

## 2020-07-16 DIAGNOSIS — R102 Pelvic and perineal pain: Secondary | ICD-10-CM | POA: Diagnosis not present

## 2020-07-24 DIAGNOSIS — Z3046 Encounter for surveillance of implantable subdermal contraceptive: Secondary | ICD-10-CM | POA: Diagnosis not present

## 2020-07-24 DIAGNOSIS — R1084 Generalized abdominal pain: Secondary | ICD-10-CM | POA: Diagnosis not present

## 2020-07-24 DIAGNOSIS — E282 Polycystic ovarian syndrome: Secondary | ICD-10-CM | POA: Diagnosis not present

## 2021-01-27 DIAGNOSIS — N6452 Nipple discharge: Secondary | ICD-10-CM | POA: Diagnosis not present

## 2021-01-27 DIAGNOSIS — E282 Polycystic ovarian syndrome: Secondary | ICD-10-CM | POA: Diagnosis not present

## 2021-01-27 DIAGNOSIS — Z3046 Encounter for surveillance of implantable subdermal contraceptive: Secondary | ICD-10-CM | POA: Diagnosis not present

## 2021-01-28 ENCOUNTER — Other Ambulatory Visit: Payer: Self-pay | Admitting: Nurse Practitioner

## 2021-01-28 DIAGNOSIS — N6452 Nipple discharge: Secondary | ICD-10-CM

## 2021-01-29 ENCOUNTER — Other Ambulatory Visit: Payer: Self-pay

## 2021-01-29 ENCOUNTER — Ambulatory Visit
Admission: RE | Admit: 2021-01-29 | Discharge: 2021-01-29 | Disposition: A | Discharge status: Home / Self Care | Payer: Self-pay | Source: Ambulatory Visit | Attending: Nurse Practitioner | Admitting: Nurse Practitioner

## 2021-01-29 DIAGNOSIS — N6452 Nipple discharge: Secondary | ICD-10-CM

## 2021-02-02 DIAGNOSIS — H5213 Myopia, bilateral: Secondary | ICD-10-CM | POA: Diagnosis not present

## 2021-02-05 DIAGNOSIS — Z8659 Personal history of other mental and behavioral disorders: Secondary | ICD-10-CM | POA: Diagnosis not present

## 2021-02-05 DIAGNOSIS — Z113 Encounter for screening for infections with a predominantly sexual mode of transmission: Secondary | ICD-10-CM | POA: Diagnosis not present

## 2021-02-05 DIAGNOSIS — R7303 Prediabetes: Secondary | ICD-10-CM | POA: Diagnosis not present

## 2021-02-05 DIAGNOSIS — Z0000 Encounter for general adult medical examination without abnormal findings: Secondary | ICD-10-CM | POA: Diagnosis not present

## 2021-02-05 DIAGNOSIS — Z111 Encounter for screening for respiratory tuberculosis: Secondary | ICD-10-CM | POA: Diagnosis not present

## 2021-02-05 DIAGNOSIS — E282 Polycystic ovarian syndrome: Secondary | ICD-10-CM | POA: Diagnosis not present

## 2021-03-13 ENCOUNTER — Other Ambulatory Visit: Payer: Self-pay

## 2021-03-13 ENCOUNTER — Encounter: Payer: Self-pay | Admitting: Registered"

## 2021-03-13 ENCOUNTER — Encounter: Payer: Medicaid Other | Attending: Nurse Practitioner | Admitting: Registered"

## 2021-03-13 DIAGNOSIS — E282 Polycystic ovarian syndrome: Secondary | ICD-10-CM | POA: Insufficient documentation
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
## 2021-04-12 ENCOUNTER — Emergency Department (HOSPITAL_COMMUNITY)
Admission: EM | Admit: 2021-04-12 | Discharge: 2021-04-12 | Disposition: A | Discharge status: Home / Self Care | Payer: Medicaid Other | Attending: Emergency Medicine | Admitting: Emergency Medicine

## 2021-04-12 ENCOUNTER — Emergency Department (HOSPITAL_COMMUNITY): Payer: Medicaid Other

## 2021-04-12 ENCOUNTER — Encounter (HOSPITAL_COMMUNITY): Payer: Self-pay | Admitting: Emergency Medicine

## 2021-04-12 ENCOUNTER — Other Ambulatory Visit: Payer: Self-pay

## 2021-04-12 DIAGNOSIS — K921 Melena: Secondary | ICD-10-CM | POA: Diagnosis not present

## 2021-04-12 DIAGNOSIS — K5900 Constipation, unspecified: Secondary | ICD-10-CM | POA: Diagnosis not present

## 2021-04-12 DIAGNOSIS — R1032 Left lower quadrant pain: Secondary | ICD-10-CM | POA: Insufficient documentation

## 2021-04-12 DIAGNOSIS — N9489 Other specified conditions associated with female genital organs and menstrual cycle: Secondary | ICD-10-CM | POA: Diagnosis not present

## 2021-04-12 DIAGNOSIS — F17210 Nicotine dependence, cigarettes, uncomplicated: Secondary | ICD-10-CM | POA: Diagnosis not present

## 2021-04-12 DIAGNOSIS — R109 Unspecified abdominal pain: Secondary | ICD-10-CM | POA: Diagnosis not present

## 2021-04-12 LAB — COMPREHENSIVE METABOLIC PANEL
ALT: 27 U/L (ref 0–44)
AST: 24 U/L (ref 15–41)
Albumin: 4.2 g/dL (ref 3.5–5.0)
Alkaline Phosphatase: 49 U/L (ref 38–126)
Anion gap: 6 (ref 5–15)
BUN: 8 mg/dL (ref 6–20)
CO2: 26 mmol/L (ref 22–32)
Calcium: 9.3 mg/dL (ref 8.9–10.3)
Chloride: 105 mmol/L (ref 98–111)
Creatinine, Ser: 0.87 mg/dL (ref 0.44–1.00)
GFR, Estimated: >60 mL/min (ref >60)
Glucose, Bld: 92 mg/dL (ref 70–99)
Potassium: 4 mmol/L (ref 3.5–5.1)
Sodium: 137 mmol/L (ref 135–145)
Total Bilirubin: 0.8 mg/dL (ref 0.3–1.2)
Total Protein: 6.9 g/dL (ref 6.5–8.1)

## 2021-04-12 LAB — CBC WITH DIFFERENTIAL/PLATELET
Abs Immature Granulocytes: 0.02 10*3/uL (ref 0.00–0.07)
Basophils Absolute: 0 10*3/uL (ref 0.0–0.1)
Basophils Relative: 1 %
Eosinophils Absolute: 0.1 10*3/uL (ref 0.0–0.5)
Eosinophils Relative: 1 %
HCT: 40.9 % (ref 36.0–46.0)
Hemoglobin: 13.9 g/dL (ref 12.0–15.0)
Immature Granulocytes: 0 %
Lymphocytes Relative: 32 %
Lymphs Abs: 2.5 10*3/uL (ref 0.7–4.0)
MCH: 33.3 pg (ref 26.0–34.0)
MCHC: 34 g/dL (ref 30.0–36.0)
MCV: 97.8 fL (ref 80.0–100.0)
Monocytes Absolute: 0.4 10*3/uL (ref 0.1–1.0)
Monocytes Relative: 6 %
Neutro Abs: 4.7 10*3/uL (ref 1.7–7.7)
Neutrophils Relative %: 60 %
Platelets: 186 10*3/uL (ref 150–400)
RBC: 4.18 MIL/uL (ref 3.87–5.11)
RDW: 12.4 % (ref 11.5–15.5)
WBC: 7.8 10*3/uL (ref 4.0–10.5)
nRBC: 0 % (ref 0.0–0.2)

## 2021-04-12 LAB — URINALYSIS, ROUTINE W REFLEX MICROSCOPIC

## 2021-04-12 LAB — I-STAT BETA HCG BLOOD, ED (MC, WL, AP ONLY): I-stat hCG, quantitative: <5 m[IU]/mL (ref <5)

## 2021-04-12 LAB — URINALYSIS, MICROSCOPIC (REFLEX): RBC / HPF: >50 RBC/hpf (ref 0–5)

## 2021-04-12 LAB — LIPASE, BLOOD: Lipase: 22 U/L (ref 11–51)

## 2021-04-12 MED ORDER — IOHEXOL 300 MG/ML  SOLN
100.0000 mL | Freq: Once | INTRAMUSCULAR | Status: AC | PRN
  Administered 2021-04-12: 100 mL via INTRAVENOUS

## 2021-04-12 MED ORDER — MORPHINE SULFATE (PF) 4 MG/ML IV SOLN
4.0000 mg | Freq: Once | INTRAVENOUS | Status: AC
  Administered 2021-04-12: 4 mg via INTRAVENOUS
  Filled 2021-04-12: qty 1

## 2021-04-12 MED ORDER — KETOROLAC TROMETHAMINE 30 MG/ML IJ SOLN
30.0000 mg | Freq: Once | INTRAMUSCULAR | Status: AC
  Administered 2021-04-12: 30 mg via INTRAVENOUS
  Filled 2021-04-12: qty 1

## 2021-04-12 MED ORDER — CEPHALEXIN 500 MG PO CAPS: 500.0000 mg | ORAL_CAPSULE | Freq: Two times a day (BID) | ORAL | 0 refills | Status: AC

## 2021-04-12 NOTE — Discharge Instructions (Addendum)
Because of your urine symptoms, I decided to start you on antibiotic for possible urine infection.  We do not see clear signs of this on your urine sample, because it was too bloody to run the typical test.  You can start taking the antibiotics as prescribed which is Keflex tonight or tomorrow morning.  Please complete the full course.  Consider adding more fiber to your diet to relieve your constipation.  You can buy Metamucil or the over-the-counter alternatives if you are not getting enough in your foods.  Continue drinking lots of water.

## 2021-04-12 NOTE — ED Provider Notes (Signed)
Emergency Medicine Provider Triage Evaluation Note  Candice Farrell , a 25 y.o. female  was evaluated in triage.  Pt complains of intermittent abd pain x 2 years. Worse over last week. Constipation. BRBPR. Has known hemorrhoids. Pressure to rectum when sitting. No hx of IBD. Review of Systems  Positive: Abd pain, blood in stool Negative: Syncope, emesis  Physical Exam  BP (!) 141/94 (BP Location: Left Arm)   Pulse 83   Temp 98.8 F (37.1 C) (Oral)   Resp 19   LMP 04/08/2021   SpO2 100%  Gen:   Awake, no distress   Resp:  Normal effort  MSK:   Moves extremities without difficulty  ABD:  Soft, non tender Other:    Medical Decision Making  Medically screening exam initiated at 10:00 AM.  Appropriate orders placed.  Candice Farrell was informed that the remainder of the evaluation will be completed by another provider, this initial triage assessment does not replace that evaluation, and the importance of remaining in the ED until their evaluation is complete.  Abd pain, blood in stool  VS stable, work up started   [employer], Valli Glance, PA-C 04/12/21 1001    Margarita Grizzle, MD 04/12/21 [phone]

## 2021-04-12 NOTE — ED Provider Notes (Signed)
[facility] EMERGENCY DEPARTMENT Provider Note   CSN: [id number] Arrival date & time: 04/12/21  0935     History Chief Complaint  Patient presents with   Abdominal Pain    Candice Farrell is a 25 y.o. female with history of constipation presenting to ED with left lower quadrant abdominal pain.  She reports that she has had persistent pain for the past 3 days.  Is worse with bowel movement.  She feels a burning or pressure sensation in left lower quadrant.  She denies any rectal pain.  She reports that she has had some bright dark-colored blood mixed with her stool and with wiping the past 3 days.  She is unaware of any history of diverticulosis.  She has never had abdominal surgery.  She denies fevers, chills, lightheadedness, dysuria, hematuria.  She reports she is on her menstrual period and does have cramping of that.  She states that she struggles with constipation as a lifelong issue from childhood.  She does have a bowel movement once every 2 days, and had 1 yesterday.  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   Postpartum depression, postpartum condition 08/15/2019   Obstetric labial laceration, delivered, current hospitalization 06/02/2019   SVD (9/17) 05/31/2019   DUB (dysfunctional uterine bleeding) 03/10/2018   MDD (major depressive disorder), recurrent episode, moderate (HCC) 03/25/2014   ODD (oppositional defiant disorder) 03/25/2014   Post traumatic stress disorder (PTSD) 03/25/2014   Cannabis abuse 03/25/2014    Past Surgical History:  Procedure Laterality Date   BREAST CYST EXCISION       OB History      Gravida  2   Para  1   Term  1   Preterm      AB  1   Living  1      SAB  1   IAB      Ectopic      Multiple  0   Live Births  1           Family History  Problem Relation Age of Onset   Diabetes Mother    Deep vein thrombosis Mother    Heart disease Father    Breast cancer Paternal Aunt     Social History   Tobacco Use   Smoking status: Some Days    Packs/day: 0.25    Types: Cigars, Cigarettes   Smokeless tobacco: Never  Vaping Use   Vaping Use: Never used  Substance Use Topics   Alcohol use: Not Currently    Comment: "occ during holidays"   Drug use: Not Currently    Types: Marijuana    Home Medications Prior to Admission medications   Medication Sig Start Date End Date Taking? Authorizing Provider  cephALEXin (KEFLEX) 500 MG capsule Take 1 capsule (500 mg total) by mouth 2 (two) times daily for 5 days. 04/12/21 04/17/21 Yes Terald Sleeper, MD  buPROPion (WELLBUTRIN XL) 150 MG 24 hr tablet Take 1 tablet (150 mg total) by mouth daily. For depression 08/17/19   Armandina Stammer I, NP  hydrOXYzine (ATARAX/VISTARIL) 25 MG tablet Take  1 tablet (25 mg total) by mouth 3 (three) times daily as needed for anxiety. 08/17/19   Antonieta Pert, MD  traZODone (DESYREL) 50 MG tablet Take 1 tablet (50 mg total) by mouth at bedtime as needed for sleep. 08/17/19   Antonieta Pert, MD    Allergies    Patient has no known allergies.  Review of Systems   Review of Systems  Constitutional:  Negative for chills and fever.  Eyes:  Negative for pain and visual disturbance.  Respiratory:  Negative for cough and shortness of breath.   Cardiovascular:  Negative for chest pain and palpitations.  Gastrointestinal:  Positive for abdominal pain, blood in stool, constipation and nausea. Negative for vomiting.  Musculoskeletal:  Negative for arthralgias and back pain.  Skin:  Negative for color change and rash.  Neurological:  Negative for syncope and headaches.  All other  systems reviewed and are negative.  Physical Exam Updated Vital Signs BP 132/68   Pulse 65   Temp 98.9 F (37.2 C) (Oral)   Resp 16   LMP 04/08/2021   SpO2 98%   Physical Exam Constitutional:      General: She is not in acute distress. HENT:     Head: Normocephalic and atraumatic.  Eyes:     Conjunctiva/sclera: Conjunctivae normal.     Pupils: Pupils are equal, round, and reactive to light.  Cardiovascular:     Rate and Rhythm: Normal rate and regular rhythm.  Pulmonary:     Effort: Pulmonary effort is normal. No respiratory distress.  Abdominal:     General: There is no distension.     Tenderness: There is abdominal tenderness in the left lower quadrant. There is no right CVA tenderness, left CVA tenderness, guarding or rebound. Negative signs include Murphy's sign.  Skin:    General: Skin is warm and dry.  Neurological:     General: No focal deficit present.     Mental Status: She is alert. Mental status is at baseline.  Psychiatric:        Mood and Affect: Mood normal.        Behavior: Behavior normal.    ED Results / Procedures / Treatments   Labs (all labs ordered are listed, but only abnormal results are displayed) Labs Reviewed  URINALYSIS, ROUTINE W REFLEX MICROSCOPIC - Abnormal; Notable for the following components:      Result Value   Color, Urine RED (*)    APPearance TURBID (*)    Glucose, UA   (*)    Value: TEST NOT REPORTED DUE TO COLOR INTERFERENCE OF URINE PIGMENT   Hgb urine dipstick   (*)    Value: TEST NOT REPORTED DUE TO COLOR INTERFERENCE OF URINE PIGMENT   Bilirubin Urine   (*)    Value: TEST NOT REPORTED DUE TO COLOR INTERFERENCE OF URINE PIGMENT   Ketones, ur   (*)    Value: TEST NOT REPORTED DUE TO COLOR INTERFERENCE OF URINE PIGMENT   Protein, ur   (*)    Value: TEST NOT REPORTED DUE TO COLOR INTERFERENCE OF URINE PIGMENT   Nitrite   (*)    Value: TEST NOT REPORTED DUE TO COLOR INTERFERENCE OF URINE PIGMENT   Leukocytes,Ua   (*)     Value: TEST NOT REPORTED DUE TO COLOR INTERFERENCE OF URINE PIGMENT   All other components within normal limits  URINALYSIS, MICROSCOPIC (REFLEX) - Abnormal; Notable for the following components:   Bacteria, UA RARE (*)  All other components within normal limits  CBC WITH DIFFERENTIAL/PLATELET  COMPREHENSIVE METABOLIC PANEL  LIPASE, BLOOD  I-STAT BETA HCG BLOOD, ED ([facility], [facility], [facility] ONLY)    EKG None  Radiology CT ABDOMEN PELVIS W CONTRAST  Result Date: 04/12/2021 CLINICAL DATA:  Left lower quadrant abdominal pain and tenderness. EXAM: CT ABDOMEN AND PELVIS WITH CONTRAST TECHNIQUE: Multidetector CT imaging of the abdomen and pelvis was performed using the standard protocol following bolus administration of intravenous contrast. CONTRAST:  OMNIPAQUE IOHEXOL 300 MG/ML  SOLN COMPARISON:  None. FINDINGS: Lower chest: No acute abnormality. Hepatobiliary: No focal liver abnormality is seen. No gallstones, gallbladder wall thickening, or biliary dilatation. Pancreas: Unremarkable.  No surrounding inflammatory changes. Spleen: Normal in size without focal abnormality. Adrenals/Urinary Tract: Adrenal glands are unremarkable. Kidneys are normal, without renal calculi, focal lesion, or hydronephrosis. Bladder is unremarkable. Stomach/Bowel: Stomach is within normal limits. Appendix appears normal. No evidence of bowel wall thickening, distention, or inflammatory changes. Vascular/Lymphatic: No significant vascular findings are present. No enlarged abdominal or pelvic lymph nodes. Reproductive: Uterus and bilateral adnexa are unremarkable. Other: No abdominal wall hernia or abnormality. No abdominopelvic ascites. Musculoskeletal: No acute or significant osseous findings. IMPRESSION: No acute findings in the abdomen or pelvis. Electronically Signed   By: Emmaline Kluver M.D.   On: 04/12/2021 18:22   DG Abdomen Acute W/Chest  Result Date: 04/12/2021 CLINICAL DATA:  Abdominal pain.  Constipation. EXAM: DG  ABDOMEN ACUTE WITH 1 VIEW CHEST COMPARISON:  None. FINDINGS: There is no evidence of dilated bowel loops or free intraperitoneal air. No radiopaque calculi or other significant radiographic abnormality is seen. Heart size and mediastinal contours are within normal limits. Both lungs are clear. IMPRESSION: Negative abdominal radiographs.  No acute cardiopulmonary disease. Electronically Signed   By: Gerome Sam III M.D   On: 04/12/2021 11:23    Procedures Procedures   Medications Ordered in ED Medications  ketorolac (TORADOL) 30 MG/ML injection 30 mg (30 mg Intravenous Given 04/12/21 1733)  morphine 4 MG/ML injection 4 mg (4 mg Intravenous Given 04/12/21 1733)  iohexol (OMNIPAQUE) 300 MG/ML solution 100 mL (100 mLs Intravenous Contrast Given 04/12/21 1803)    ED Course  I have reviewed the triage vital signs and the nursing notes.  Pertinent labs & imaging results that were available during my care of the patient were reviewed by me and considered in my medical decision making (see chart for details).  Ddx includes constipation vs colitis vs diverticulitis vs UTI vs kidney stone vs uterine fibroids vs ovarian cyst vs other  Patient is quite well-appearing on exam.  I doubt this ovarian torsion.  The symptoms seem to be related to defecation.  I suspect that she may have diverticulosis related to her history of constipation, but is having some small diverticular bleed.  I reviewed her labs does not show any evidence of anemia, no leukocytosis, otherwise unremarkable.  Her UA is grossly bloody which is likely related to her menstrual period.  I have a lower suspicion for UTI at this time.  We are pending CT scan to evaluate for intra-abdominal pathology as noted above.  I have given the patient a dose of IV Toradol and IV morphine for her pain.   Clinical Course as of 04/12/21 2301  Wynelle Link Apr 12, 2021  3646 Patient reassessed.  Pain is improved.  She reports that she feels that she has been  having urinary frequency or dysuria recently, and I think is reasonable to treat for possible  UTI.  I will start her on 5 days course of Keflex.  Otherwise okay for discharge. [MT]    Clinical Course User Index [MT] Terrianna Holsclaw, Kermit BaloMatthew J, MD   Final Clinical Impression(s) / ED Diagnoses Final diagnoses:  Left lower quadrant abdominal pain    Rx / DC Orders ED Discharge Orders          Ordered    cephALEXin (KEFLEX) 500 MG capsule  2 times daily        04/12/21 1846             Terald Sleeperrifan, Elston Aldape J, MD 04/12/21 [phone]

## 2021-04-12 NOTE — ED Triage Notes (Signed)
Pt reports intermittent LLQ pain x 2 years.  Pain worse x 3 days with nausea and rectal bleeding.  Reports intermittent constipation.

## 2021-04-13 ENCOUNTER — Telehealth: Payer: Self-pay

## 2021-04-13 NOTE — Telephone Encounter (Signed)
Transition Care Management Unsuccessful Follow-up Telephone Call  Date of discharge and from where:  04/12/2021-[place] [facility]   Attempts:  1st Attempt  Reason for unsuccessful TCM follow-up call:  Left voice message

## 2021-04-14 NOTE — Telephone Encounter (Signed)
Transition Care Management Follow-up Telephone Call Date of discharge and from where: 04/12/2021-[place] [facility] How have you been since you were released from the hospital? Patient stated she is doing ok but has been having some headaches.  Any questions or concerns? No  Items Reviewed: Did the pt receive and understand the discharge instructions provided? Yes  Medications obtained and verified? Yes  Other? No  Any new allergies since your discharge? No  Dietary orders reviewed? N/A Do you have support at home? Yes   Home Care and Equipment/Supplies: Were home health services ordered? not applicable If so, what is the name of the agency? N/A  Has the agency set up a time to come to the patient's home? not applicable Were any new equipment or medical supplies ordered?  No What is the name of the medical supply agency? N/A Were you able to get the supplies/equipment? not applicable Do you have any questions related to the use of the equipment or supplies? No  Functional Questionnaire: (I = Independent and D = Dependent) ADLs: I  Bathing/Dressing- I  Meal Prep- I  Eating- I  Maintaining continence- I  Transferring/Ambulation- I  Managing Meds- I  Follow up appointments reviewed:  PCP Hospital f/u appt confirmed? No  Specialist Hospital f/u appt confirmed? No   Are transportation arrangements needed? No  If their condition worsens, is the pt aware to call PCP or go to the Emergency Dept.? Yes Was the patient provided with contact information for the PCP's office or ED? Yes Was to pt encouraged to call back with questions or concerns? Yes

## 2021-04-15 ENCOUNTER — Telehealth: Payer: Self-pay | Admitting: *Deleted

## 2021-04-15 NOTE — Telephone Encounter (Signed)
Please schedule patient for an opening with [facility] for ED FU: referral to [facility]. Patient left VM on MMU result cell phone. No schedule is available at this time for MMU.

## 2021-04-27 ENCOUNTER — Encounter: Payer: Medicaid Other | Attending: Nurse Practitioner | Admitting: Registered"

## 2021-04-27 DIAGNOSIS — E282 Polycystic ovarian syndrome: Secondary | ICD-10-CM | POA: Insufficient documentation

## 2021-06-26 DIAGNOSIS — Z3202 Encounter for pregnancy test, result negative: Secondary | ICD-10-CM | POA: Diagnosis not present

## 2021-07-07 DIAGNOSIS — R350 Frequency of micturition: Secondary | ICD-10-CM | POA: Diagnosis not present

## 2021-07-07 DIAGNOSIS — R102 Pelvic and perineal pain: Secondary | ICD-10-CM | POA: Diagnosis not present

## 2021-07-07 DIAGNOSIS — N898 Other specified noninflammatory disorders of vagina: Secondary | ICD-10-CM | POA: Diagnosis not present

## 2021-07-07 DIAGNOSIS — N949 Unspecified condition associated with female genital organs and menstrual cycle: Secondary | ICD-10-CM | POA: Diagnosis not present

## 2021-07-08 DIAGNOSIS — N949 Unspecified condition associated with female genital organs and menstrual cycle: Secondary | ICD-10-CM | POA: Diagnosis not present

## 2021-07-08 DIAGNOSIS — R102 Pelvic and perineal pain: Secondary | ICD-10-CM | POA: Diagnosis not present

## 2021-07-15 DIAGNOSIS — Z3201 Encounter for pregnancy test, result positive: Secondary | ICD-10-CM | POA: Diagnosis not present

## 2021-07-26 ENCOUNTER — Encounter (HOSPITAL_COMMUNITY): Payer: Self-pay | Admitting: Obstetrics and Gynecology

## 2021-07-26 ENCOUNTER — Inpatient Hospital Stay (HOSPITAL_COMMUNITY): Payer: Medicaid Other

## 2021-07-26 ENCOUNTER — Inpatient Hospital Stay (HOSPITAL_COMMUNITY)
Admission: AD | Admit: 2021-07-26 | Discharge: 2021-07-26 | Disposition: A | Discharge status: Home / Self Care | Payer: Medicaid Other | Attending: Obstetrics and Gynecology | Admitting: Obstetrics and Gynecology

## 2021-07-26 ENCOUNTER — Other Ambulatory Visit: Payer: Self-pay

## 2021-07-26 DIAGNOSIS — R1030 Lower abdominal pain, unspecified: Secondary | ICD-10-CM | POA: Insufficient documentation

## 2021-07-26 DIAGNOSIS — Z87891 Personal history of nicotine dependence: Secondary | ICD-10-CM | POA: Insufficient documentation

## 2021-07-26 DIAGNOSIS — O26891 Other specified pregnancy related conditions, first trimester: Secondary | ICD-10-CM | POA: Insufficient documentation

## 2021-07-26 DIAGNOSIS — O210 Mild hyperemesis gravidarum: Secondary | ICD-10-CM | POA: Diagnosis not present

## 2021-07-26 DIAGNOSIS — O26899 Other specified pregnancy related conditions, unspecified trimester: Secondary | ICD-10-CM

## 2021-07-26 DIAGNOSIS — Z3A01 Less than 8 weeks gestation of pregnancy: Secondary | ICD-10-CM | POA: Diagnosis not present

## 2021-07-26 HISTORY — DX: Polycystic ovarian syndrome: E28.2

## 2021-07-26 LAB — CBC
HCT: 42.5 % (ref 36.0–46.0)
Hemoglobin: 14.1 g/dL (ref 12.0–15.0)
MCH: 32.6 pg (ref 26.0–34.0)
MCHC: 33.2 g/dL (ref 30.0–36.0)
MCV: 98.2 fL (ref 80.0–100.0)
Platelets: 204 10*3/uL (ref 150–400)
RBC: 4.33 MIL/uL (ref 3.87–5.11)
RDW: 12.6 % (ref 11.5–15.5)
WBC: 11.6 10*3/uL — ABNORMAL HIGH (ref 4.0–10.5)
nRBC: 0 % (ref 0.0–0.2)

## 2021-07-26 LAB — URINALYSIS, ROUTINE W REFLEX MICROSCOPIC
Bilirubin Urine: NEGATIVE
Glucose, UA: NEGATIVE mg/dL
Hgb urine dipstick: NEGATIVE
Ketones, ur: 20 mg/dL — AB
Leukocytes,Ua: NEGATIVE
Nitrite: NEGATIVE
Protein, ur: NEGATIVE mg/dL
Specific Gravity, Urine: 1.031 — ABNORMAL HIGH (ref 1.005–1.030)
pH: 6 (ref 5.0–8.0)

## 2021-07-26 LAB — OB RESULTS CONSOLE RUBELLA ANTIBODY, IGM: Rubella: IMMUNE

## 2021-07-26 LAB — POCT PREGNANCY, URINE: Preg Test, Ur: POSITIVE — AB

## 2021-07-26 LAB — WET PREP, GENITAL
Clue Cells Wet Prep HPF POC: NONE SEEN
Sperm: NONE SEEN
Trich, Wet Prep: NONE SEEN
WBC, Wet Prep HPF POC: NONE SEEN
Yeast Wet Prep HPF POC: NONE SEEN

## 2021-07-26 LAB — HCG, QUANTITATIVE, PREGNANCY: hCG, Beta Chain, Quant, S: 132101 m[IU]/mL — ABNORMAL HIGH (ref <5)

## 2021-07-26 LAB — HIV ANTIBODY (ROUTINE TESTING W REFLEX): HIV Screen 4th Generation wRfx: NONREACTIVE

## 2021-07-26 MED ORDER — METOCLOPRAMIDE HCL 10 MG PO TABS
10.0000 mg | ORAL_TABLET | Freq: Four times a day (QID) | ORAL | 0 refills | Status: DC
Start: 2021-07-26 — End: 2022-03-08

## 2021-07-26 MED ORDER — SCOPOLAMINE 1 MG/3DAYS TD PT72
1.0000 | MEDICATED_PATCH | Freq: Once | TRANSDERMAL | Status: DC
  Administered 2021-07-26: 1.5 mg via TRANSDERMAL
  Filled 2021-07-26: qty 1

## 2021-07-26 MED ORDER — ONDANSETRON 4 MG PO TBDP
4.0000 mg | ORAL_TABLET | Freq: Three times a day (TID) | ORAL | 0 refills | Status: DC | PRN
Start: 2021-07-26 — End: 2022-03-08

## 2021-07-26 MED ORDER — SODIUM CHLORIDE 0.9 % IV SOLN
25.0000 mg | Freq: Once | INTRAVENOUS | Status: AC
  Administered 2021-07-26: 25 mg via INTRAVENOUS
  Filled 2021-07-26: qty 1

## 2021-07-26 MED ORDER — PROMETHAZINE HCL 25 MG PO TABS
25.0000 mg | ORAL_TABLET | Freq: Four times a day (QID) | ORAL | 0 refills | Status: DC | PRN
Start: 2021-07-26 — End: 2022-03-08

## 2021-07-26 NOTE — MAU Note (Signed)
Candice Farrell is a 25 y.o. here in MAU reporting: for the past 3 days has been having increased nausea and vomiting. States not able to keep anything down. 4 episodes of vomiting in the past 24 hours. Having some mild cramping at this time. Denies bleeding or discharge.  LMP: unknown, states she has PCOS so her periods are irregular, + UPT on Nov3  Onset of complaint: ongoing  Pain score: 4/10  Vitals:   07/26/21 1506  BP: (!) 109/58  Pulse: 67  Resp: 16  Temp: 98.6 F (37 C)  SpO2: 99%     Lab orders placed from triage: upt, ua

## 2021-07-26 NOTE — MAU Provider Note (Signed)
History     CSN: [id number]  Arrival date and time: 07/26/21 1442   Event Date/Time   First Provider Initiated Contact with Patient 07/26/21 1520      Chief Complaint  Patient presents with   Abdominal Pain   Nausea   HPI [name] is a 25 y.o. year old [id number] female at ~[redacted] weeks gestation per patient who presents to MAU reporting increased N/V over the past 3 days. She reports the N/V has been going on for 2 wks, but the past 3 days has worsened. She is unable to keep anything down. She last attempted to eat 2 crackers at 1300 today, but threw them up. She last drank some water at 1445. She also reports mild lower abdominal cramping/pain. She denies VB or abnormal d/c. She reports she did not have morning sickness like this with her first pregnancy. Her sister is present and contributing to the history taking.   OB History     Gravida  3   Para  1   Term  1   Preterm      AB  1   Living  1      SAB  1   IAB      Ectopic      Multiple  0   Live Births  1           Past Medical History:  Diagnosis Date   Anxiety    Back pain    Depression    PCOS (polycystic ovarian syndrome)    PONV (postoperative nausea and vomiting)    Scoliosis     Past Surgical History:  Procedure Laterality Date   BREAST CYST EXCISION     WISDOM TOOTH EXTRACTION      Family History  Problem Relation Age of Onset   Diabetes Mother    Deep vein thrombosis Mother    Heart disease Father    Breast cancer Paternal Aunt     Social History   Tobacco Use   Smoking status: Former    Packs/day: 0.25    Types: Cigars, Cigarettes   Smokeless tobacco: Never  Vaping Use   Vaping Use: Never used  Substance Use Topics   Alcohol use: Not Currently    Comment: "occ during holidays"   Drug use: Not Currently    Types: Marijuana    Allergies:  Allergies  Allergen Reactions   Morphine And Related Rash    Medications Prior to Admission  Medication Sig Dispense  Refill Last Dose   doxylamine, Sleep, (UNISOM) 25 MG tablet Take 25 mg by mouth at bedtime as needed.   07/26/2021 at 0930   Prenatal Vit-Fe Fumarate-FA (MULTIVITAMIN-PRENATAL) 27-0.8 MG TABS tablet Take 1 tablet by mouth daily at 12 noon.   Past Week   pyridOXINE (VITAMIN B-6) 100 MG tablet Take 100 mg by mouth daily.   07/26/2021 at 0930   buPROPion (WELLBUTRIN XL) 150 MG 24 hr tablet Take 1 tablet (150 mg total) by mouth daily. For depression 30 tablet 0    hydrOXYzine (ATARAX/VISTARIL) 25 MG tablet Take 1 tablet (25 mg total) by mouth 3 (three) times daily as needed for anxiety. 90 tablet 0    traZODone (DESYREL) 50 MG tablet Take 1 tablet (50 mg total) by mouth at bedtime as needed for sleep. 30 tablet 0 More than a month    Review of Systems  Constitutional:  Positive for appetite change and fatigue.  HENT: Negative.    Eyes:  Negative.   Respiratory: Negative.    Cardiovascular: Negative.   Gastrointestinal:  Positive for nausea and vomiting.  Endocrine: Negative.   Genitourinary:  Positive for pelvic pain.  Musculoskeletal: Negative.   Skin: Negative.   Allergic/Immunologic: Negative.   Neurological:  Positive for weakness.  Hematological: Negative.   Psychiatric/Behavioral: Negative.    Physical Exam   Blood pressure (!) 109/58, pulse 67, temperature 98.6 F (37 C), temperature source Oral, resp. rate 16, height 5' (1.524 m), weight 75.5 kg, SpO2 99 %, unknown if currently breastfeeding.  Physical Exam Vitals and nursing note reviewed.  Constitutional:      Appearance: Normal appearance. She is normal weight.  Cardiovascular:     Rate and Rhythm: Normal rate.  Pulmonary:     Effort: Pulmonary effort is normal.  Abdominal:     Palpations: Abdomen is soft.  Genitourinary:    Comments: Wet pre and GC/CT through blind swab by RN  Skin:    General: Skin is warm and dry.  Neurological:     Mental Status: She is alert and oriented to person, place, and time.   Psychiatric:        Mood and Affect: Mood normal.        Behavior: Behavior normal.        Thought Content: Thought content normal.        Judgment: Judgment normal.    MAU Course  Procedures  MDM CCUA UPT CBC ABO/Rh HCG Wet Prep GC/CT -- pending HIV -- pending OB < 14 wks [place] with TV IVFs: Phenergan 25 mg in LR 1000 ml @ 999 ml/hr; followed by MVI in LR 1000 ml @ 999 ml/hr -- resolved nausea/vomiting Pepcid 20 mg IVPB PO Challenge -- patient tolerated well Scopolamine 1.5 mg transdermal patch  Results for orders placed or performed during the hospital encounter of 07/26/21 (from the past 24 hour(s))  Pregnancy, urine POC     Status: Abnormal   Collection Time: 07/26/21  2:57 PM  Result Value Ref Range   Preg Test, Ur POSITIVE (A) NEGATIVE  Urinalysis, Routine w reflex microscopic Urine, Clean Catch     Status: Abnormal   Collection Time: 07/26/21  3:00 PM  Result Value Ref Range   Color, Urine AMBER (A) YELLOW   APPearance HAZY (A) CLEAR   Specific Gravity, Urine 1.031 (H) 1.005 - 1.030   pH 6.0 5.0 - 8.0   Glucose, UA NEGATIVE NEGATIVE mg/dL   Hgb urine dipstick NEGATIVE NEGATIVE   Bilirubin Urine NEGATIVE NEGATIVE   Ketones, ur 20 (A) NEGATIVE mg/dL   Protein, ur NEGATIVE NEGATIVE mg/dL   Nitrite NEGATIVE NEGATIVE   Leukocytes,Ua NEGATIVE NEGATIVE  hCG, quantitative, pregnancy     Status: Abnormal   Collection Time: 07/26/21  3:51 PM  Result Value Ref Range   hCG, Beta Chain, Quant, S 132,101 (H) <5 mIU/mL  CBC     Status: Abnormal   Collection Time: 07/26/21  3:57 PM  Result Value Ref Range   WBC 11.6 (H) 4.0 - 10.5 K/uL   RBC 4.33 3.87 - 5.11 MIL/uL   Hemoglobin 14.1 12.0 - 15.0 g/dL   HCT 54.3 60.6 - 77.0 %   MCV 98.2 80.0 - 100.0 fL   MCH 32.6 26.0 - 34.0 pg   MCHC 33.2 30.0 - 36.0 g/dL   RDW 34.0 35.2 - 48.1 %   Platelets 204 150 - 400 K/uL   nRBC 0.0 0.0 - 0.2 %  Wet prep, genital  Status: None   Collection Time: 07/26/21  3:57 PM    Specimen: Vaginal  Result Value Ref Range   Yeast Wet Prep HPF POC NONE SEEN NONE SEEN   Trich, Wet Prep NONE SEEN NONE SEEN   Clue Cells Wet Prep HPF POC NONE SEEN NONE SEEN   WBC, Wet Prep HPF POC NONE SEEN NONE SEEN   Sperm NONE SEEN     US OB Comp Less 14 Wks  Result Date: 07/26/2021 CLINICAL DATA:  Abdominal pain EXAM: OBSTETRIC <14 WK ULTRASOUND TECHNIQUE: Transabdominal ultrasound was performed for evaluation of the gestation as well as the maternal uterus and adnexal regions. COMPARISON:  None. FINDINGS: Intrauterine gestational sac: Single Yolk sac:  Visualized. Embryo:  Visualized. Cardiac Activity: Visualized. Heart Rate: 137 bpm MSD:  28.8 mm   7 w   6 d CRL:   10 mm   7 w 0 d                  [place] EDC: 03/14/2022 Subchorionic hemorrhage:  None visualized. Maternal uterus/adnexae: Unremarkable IMPRESSION: Single live intrauterine gestation with approximate gestational age of [redacted] weeks and 0 days, EDC based on today's sonogram is 03/14/2022. Electronically Signed   By: Larose Hires D.O.   On: 07/26/2021 16:39     Assessment and Plan  Morning sickness  - Rx for Phenergan 25 mg at bedtime - Rx for Reglan 10 mg every 6 hours during daytime hours prn N/V - Rx for Zofran 4 mg ODT every 8 hours for severe N/V - Scopolamine patch x 3 days - Information provided on morning sickness   Abdominal pain during pregnancy in first trimester  - Information provided on abdominal pain in pregnancy   - Discharge patient - Keep scheduled appt with [facility] - Patient verbalized an understanding of the plan of care and agrees.    Raelyn Mora, CNM 07/26/2021, 3:36 PM

## 2021-07-27 LAB — GC/CHLAMYDIA PROBE AMP (~~LOC~~) NOT AT ARMC
Chlamydia: NEGATIVE
Comment: NEGATIVE
Comment: NORMAL
Neisseria Gonorrhea: NEGATIVE

## 2021-08-04 DIAGNOSIS — Z3490 Encounter for supervision of normal pregnancy, unspecified, unspecified trimester: Secondary | ICD-10-CM | POA: Diagnosis not present

## 2021-08-04 DIAGNOSIS — Z3480 Encounter for supervision of other normal pregnancy, unspecified trimester: Secondary | ICD-10-CM | POA: Diagnosis not present

## 2021-08-04 DIAGNOSIS — Z3481 Encounter for supervision of other normal pregnancy, first trimester: Secondary | ICD-10-CM | POA: Diagnosis not present

## 2021-08-04 LAB — OB RESULTS CONSOLE ABO/RH: RH Type: POSITIVE

## 2021-08-04 LAB — OB RESULTS CONSOLE HEPATITIS B SURFACE ANTIGEN: Hepatitis B Surface Ag: NEGATIVE

## 2021-08-04 LAB — OB RESULTS CONSOLE HIV ANTIBODY (ROUTINE TESTING): HIV: NONREACTIVE

## 2021-08-04 LAB — OB RESULTS CONSOLE ANTIBODY SCREEN: Antibody Screen: NEGATIVE

## 2021-08-04 LAB — HEPATITIS C ANTIBODY: HCV Ab: NEGATIVE

## 2021-08-11 DIAGNOSIS — Z3481 Encounter for supervision of other normal pregnancy, first trimester: Secondary | ICD-10-CM | POA: Diagnosis not present

## 2021-08-19 DIAGNOSIS — O26899 Other specified pregnancy related conditions, unspecified trimester: Secondary | ICD-10-CM | POA: Diagnosis not present

## 2021-09-03 ENCOUNTER — Ambulatory Visit (HOSPITAL_COMMUNITY)
Admission: EM | Admit: 2021-09-03 | Discharge: 2021-09-03 | Disposition: A | Discharge status: Home / Self Care | Payer: Medicaid Other | Attending: Emergency Medicine | Admitting: Emergency Medicine

## 2021-09-03 ENCOUNTER — Encounter (HOSPITAL_COMMUNITY): Payer: Self-pay | Admitting: Emergency Medicine

## 2021-09-03 ENCOUNTER — Other Ambulatory Visit: Payer: Self-pay

## 2021-09-03 DIAGNOSIS — J069 Acute upper respiratory infection, unspecified: Secondary | ICD-10-CM | POA: Diagnosis not present

## 2021-09-03 LAB — RESPIRATORY PANEL BY PCR

## 2021-09-03 LAB — POC INFLUENZA A AND B ANTIGEN (URGENT CARE ONLY)
INFLUENZA A ANTIGEN, POC: NEGATIVE
INFLUENZA B ANTIGEN, POC: NEGATIVE

## 2021-09-03 NOTE — ED Triage Notes (Signed)
Pt is present today with nasal congestion, cough, sinus pressure, fatigue, bilateral eye pain, and HA. Pt states that her sx started Tuesday.

## 2021-09-03 NOTE — Discharge Instructions (Signed)
We will contact you if your respiratory panel test is positive.  Please quarantine while you wait for the results.  If your test is negative you may resume normal activities.  If your test is positive please continue to quarantine for at least 5 days from your symptom onset or until you are without a fever for at least 24 hours after the medications.  Flu test negative  Inside your packet with a list of over-the-counter medications that are safe for you to take while you are pregnant, please defer to this and your gynecologist for further guidance

## 2021-09-03 NOTE — ED Provider Notes (Signed)
[place]    CSN: [id number] Arrival date & time: 09/03/21  0908      History   Chief Complaint Chief Complaint  Patient presents with   Nasal Congestion   Otalgia   Headache    HPI Candice Farrell is a 25 y.o. female.   Patient presents with congestion, bilateral ear pain, rhinorrhea, sore throat, nonproductive cough, generalized abdominal pain and headaches for 3 days. Currently pregnant, first trimester. Has attempted treatment, robitussin, which is not helpful.   Past Medical History:  Diagnosis Date   Anxiety    Back pain    Depression    PCOS (polycystic ovarian syndrome)    PONV (postoperative nausea and vomiting)    Scoliosis     Patient Active Problem List   Diagnosis Date Noted   PCOS (polycystic ovarian syndrome) 03/13/2021   MDD (major depressive disorder) 08/15/2019   MDD (major depressive disorder), recurrent severe, without psychosis ([place]) 08/15/2019   Postpartum depression, postpartum condition 08/15/2019   Obstetric labial laceration, delivered, current hospitalization 06/02/2019   SVD (9/17) 05/31/2019   DUB (dysfunctional uterine bleeding) 03/10/2018   MDD (major depressive disorder), recurrent episode, moderate ([place]) 03/25/2014   ODD (oppositional defiant disorder) 03/25/2014   Post traumatic stress disorder (PTSD) 03/25/2014   Cannabis abuse 03/25/2014    Past Surgical History:  Procedure Laterality Date   BREAST CYST EXCISION     WISDOM TOOTH EXTRACTION      OB History     Gravida  3   Para  1   Term  1   Preterm      AB  1   Living  1      SAB  1   IAB      Ectopic      Multiple  0   Live Births  1            Home Medications    Prior to Admission medications   Medication Sig Start Date End Date Taking? Authorizing Provider  metoCLOPramide (REGLAN) 10 MG tablet Take 1 tablet (10 mg total) by mouth every 6 (six) hours. Take during daytime hours 07/26/21   Laury Deep, CNM  ondansetron  (ZOFRAN ODT) 4 MG disintegrating tablet Take 1 tablet (4 mg total) by mouth every 8 (eight) hours as needed for nausea or vomiting. Use for severe nausea and/or vomiting 07/26/21   Laury Deep, CNM  Prenatal Vit-Fe Fumarate-FA (MULTIVITAMIN-PRENATAL) 27-0.8 MG TABS tablet Take 1 tablet by mouth daily at 12 noon.    [provider]  promethazine (PHENERGAN) 25 MG tablet Take 1 tablet (25 mg total) by mouth every 6 (six) hours as needed for nausea or vomiting. Take at bedtime 07/26/21   Laury Deep, CNM  traZODone (DESYREL) 50 MG tablet Take 1 tablet (50 mg total) by mouth at bedtime as needed for sleep. 08/17/19   Sharma Covert, MD    Family History Family History  Problem Relation Age of Onset   Diabetes Mother    Deep vein thrombosis Mother    Heart disease Father    Breast cancer Paternal Aunt     Social History Social History   Tobacco Use   Smoking status: Former    Packs/day: 0.25    Types: Cigars, Cigarettes   Smokeless tobacco: Never  Vaping Use   Vaping Use: Never used  Substance Use Topics   Alcohol use: Not Currently    Comment: "occ during holidays"   Drug use: Not Currently  Types: Marijuana     Allergies   Morphine and related   Review of Systems Review of Systems  Constitutional: Negative.   HENT:  Positive for congestion, ear pain, rhinorrhea and sore throat. Negative for dental problem, drooling, ear discharge, facial swelling, hearing loss, mouth sores, nosebleeds, postnasal drip, sinus pressure, sinus pain, sneezing, tinnitus, trouble swallowing and voice change.   Respiratory:  Positive for cough. Negative for apnea, choking, chest tightness, shortness of breath, wheezing and stridor.   Cardiovascular: Negative.   Gastrointestinal:  Positive for abdominal pain. Negative for abdominal distention, anal bleeding, blood in stool, constipation, diarrhea, nausea, rectal pain and vomiting.  Skin: Negative.   Neurological:  Positive for  headaches. Negative for dizziness, tremors, seizures, syncope, facial asymmetry, speech difficulty, weakness, light-headedness and numbness.    Physical Exam Triage Vital Signs ED Triage Vitals  Enc Vitals Group     BP 09/03/21 0934 (!) 132/56     Pulse Rate 09/03/21 0934 75     Resp 09/03/21 0934 18     Temp 09/03/21 0934 98.6 F (37 C)     Temp Source 09/03/21 0934 Oral     SpO2 09/03/21 0934 99 %     Weight --      Height --      Head Circumference --      Peak Flow --      Pain Score 09/03/21 0932 7     Pain Loc --      Pain Edu? --      Excl. in GC? --    No data found.  Updated Vital Signs BP (!) 132/56    Pulse 75    Temp 98.6 F (37 C) (Oral)    Resp 18    LMP  (LMP Unknown)    SpO2 99%   Visual Acuity Right Eye Distance:   Left Eye Distance:   Bilateral Distance:    Right Eye Near:   Left Eye Near:    Bilateral Near:     Physical Exam Constitutional:      Appearance: Normal appearance. She is normal weight.  HENT:     Head: Normocephalic.     Right Ear: Hearing, ear canal and external ear normal. A middle ear effusion is present.     Left Ear: Hearing, ear canal and external ear normal. A middle ear effusion is present.     Nose: Congestion and rhinorrhea present.     Mouth/Throat:     Mouth: Mucous membranes are moist.     Pharynx: Oropharynx is clear.  Eyes:     Extraocular Movements: Extraocular movements intact.  Cardiovascular:     Rate and Rhythm: Normal rate and regular rhythm.     Pulses: Normal pulses.     Heart sounds: Normal heart sounds.  Pulmonary:     Effort: Pulmonary effort is normal.     Breath sounds: Normal breath sounds.  Musculoskeletal:     Cervical back: Normal range of motion.  Lymphadenopathy:     Cervical: Cervical adenopathy present.  Skin:    General: Skin is warm and dry.  Neurological:     General: No focal deficit present.     Mental Status: She is alert and oriented to person, place, and time. Mental status is  at baseline.  Psychiatric:        Mood and Affect: Mood normal.        Behavior: Behavior normal.     UC Treatments / Results  Labs (all labs ordered are listed, but only abnormal results are displayed) Labs Reviewed  POC INFLUENZA A AND B ANTIGEN (URGENT CARE ONLY)    EKG   Radiology No results found.  Procedures Procedures (including critical care time)  Medications Ordered in UC Medications - No data to display  Initial Impression / Assessment and Plan / UC Course  I have reviewed the triage vital signs and the nursing notes.  Pertinent labs & imaging results that were available during my care of the patient were reviewed by me and considered in my medical decision making (see chart for details).  Viral URI with cough  1.  Flu test negative, respiratory panel pending per the request of obstetrician 2.  Given list of over-the-counter medications that are safe during pregnancy 3.  Urgent care follow-up as needed 4.  Work note given Final Clinical Impressions(s) / UC Diagnoses   Final diagnoses:  None   Discharge Instructions   None    ED Prescriptions   None    PDMP not reviewed this encounter.   Hans Eden, NP 09/03/21 1017

## 2021-09-10 DIAGNOSIS — Z3481 Encounter for supervision of other normal pregnancy, first trimester: Secondary | ICD-10-CM | POA: Diagnosis not present

## 2021-09-10 DIAGNOSIS — E669 Obesity, unspecified: Secondary | ICD-10-CM | POA: Diagnosis not present

## 2021-10-29 DIAGNOSIS — Z3490 Encounter for supervision of normal pregnancy, unspecified, unspecified trimester: Secondary | ICD-10-CM | POA: Diagnosis not present

## 2021-10-29 DIAGNOSIS — Z3482 Encounter for supervision of other normal pregnancy, second trimester: Secondary | ICD-10-CM | POA: Diagnosis not present

## 2021-10-29 DIAGNOSIS — Z36 Encounter for antenatal screening for chromosomal anomalies: Secondary | ICD-10-CM | POA: Diagnosis not present

## 2021-11-26 DIAGNOSIS — Z3490 Encounter for supervision of normal pregnancy, unspecified, unspecified trimester: Secondary | ICD-10-CM | POA: Diagnosis not present

## 2021-11-26 DIAGNOSIS — Z3482 Encounter for supervision of other normal pregnancy, second trimester: Secondary | ICD-10-CM | POA: Diagnosis not present

## 2021-11-26 LAB — OB RESULTS CONSOLE RPR: RPR: NONREACTIVE

## 2021-12-01 DIAGNOSIS — R7309 Other abnormal glucose: Secondary | ICD-10-CM | POA: Diagnosis not present

## 2021-12-11 DIAGNOSIS — O24410 Gestational diabetes mellitus in pregnancy, diet controlled: Secondary | ICD-10-CM | POA: Diagnosis not present

## 2021-12-16 DIAGNOSIS — Z23 Encounter for immunization: Secondary | ICD-10-CM | POA: Diagnosis not present

## 2021-12-16 DIAGNOSIS — O24410 Gestational diabetes mellitus in pregnancy, diet controlled: Secondary | ICD-10-CM | POA: Diagnosis not present

## 2021-12-31 DIAGNOSIS — O24419 Gestational diabetes mellitus in pregnancy, unspecified control: Secondary | ICD-10-CM | POA: Diagnosis not present

## 2021-12-31 DIAGNOSIS — O99213 Obesity complicating pregnancy, third trimester: Secondary | ICD-10-CM | POA: Diagnosis not present

## 2022-01-14 DIAGNOSIS — O368130 Decreased fetal movements, third trimester, not applicable or unspecified: Secondary | ICD-10-CM | POA: Diagnosis not present

## 2022-01-19 DIAGNOSIS — O219 Vomiting of pregnancy, unspecified: Secondary | ICD-10-CM | POA: Diagnosis not present

## 2022-01-19 DIAGNOSIS — O99213 Obesity complicating pregnancy, third trimester: Secondary | ICD-10-CM | POA: Diagnosis not present

## 2022-01-19 DIAGNOSIS — K30 Functional dyspepsia: Secondary | ICD-10-CM | POA: Diagnosis not present

## 2022-01-19 DIAGNOSIS — O24419 Gestational diabetes mellitus in pregnancy, unspecified control: Secondary | ICD-10-CM | POA: Diagnosis not present

## 2022-01-25 ENCOUNTER — Encounter (HOSPITAL_COMMUNITY): Payer: Self-pay

## 2022-01-25 ENCOUNTER — Ambulatory Visit (INDEPENDENT_AMBULATORY_CARE_PROVIDER_SITE_OTHER): Payer: Medicaid Other | Admitting: Licensed Clinical Social Worker

## 2022-01-25 DIAGNOSIS — F319 Bipolar disorder, unspecified: Secondary | ICD-10-CM | POA: Insufficient documentation

## 2022-01-25 DIAGNOSIS — F315 Bipolar disorder, current episode depressed, severe, with psychotic features: Secondary | ICD-10-CM | POA: Diagnosis not present

## 2022-01-25 DIAGNOSIS — F3181 Bipolar II disorder: Secondary | ICD-10-CM | POA: Insufficient documentation

## 2022-01-25 NOTE — Plan of Care (Signed)
?  Problem: Bipolar Disorder CCP Problem  1 Learn and Apply Coping Skills to Manage Bipolar Symptoms and Decrease Anxiety Symptoms ?Goal: Candice Farrell will attend at least 80% of scheduled individual psychotherapy  sessions  ?Outcome: Not Applicable ?Goal: LTG: Stabilize mood and increase goal-directed behavior ?Outcome: Not Applicable ?Goal: STG: Candice Farrell WILL IDENTIFY COGNITIVE PATTERNS AND BELIEFS THAT INTERFERE WITH THERAPY ?Outcome: Not Applicable ?  ?
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
## 2022-02-03 ENCOUNTER — Ambulatory Visit (HOSPITAL_COMMUNITY): Payer: Medicaid Other | Admitting: Licensed Clinical Social Worker

## 2022-02-03 DIAGNOSIS — O99213 Obesity complicating pregnancy, third trimester: Secondary | ICD-10-CM | POA: Diagnosis not present

## 2022-02-03 DIAGNOSIS — O24419 Gestational diabetes mellitus in pregnancy, unspecified control: Secondary | ICD-10-CM | POA: Diagnosis not present

## 2022-02-09 IMAGING — CT CT ABD-PELV W/ CM
2 of 4 series · 17 of 46 positions shown, 19 images · IV contrast (Omni 300)
Comparison: None.

CLINICAL DATA: Left lower quadrant abdominal pain and tenderness.

EXAM:
CT ABDOMEN AND PELVIS WITH CONTRAST
TECHNIQUE: Multidetector CT imaging of the abdomen and pelvis was performed
using the standard protocol following bolus administration of
intravenous contrast.
CONTRAST:  100mL OMNIPAQUE IOHEXOL 300 MG/ML  SOLN

[Series 3: a/p w/ 5mm · axial · 0.72mm/px · z∈[+889,+1324]mm · 14 of 95 slices shown, 16 images]
[im 4/95  soft-tissue]
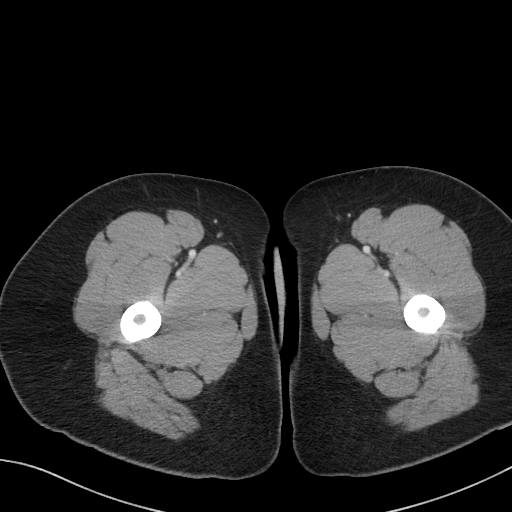
[im 4/95  bone]
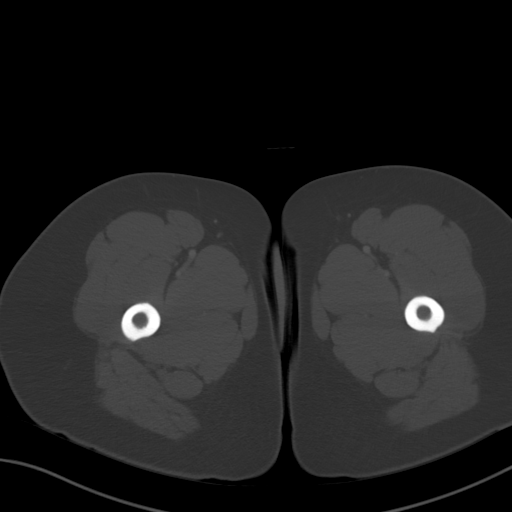
[im 12/95  soft-tissue]
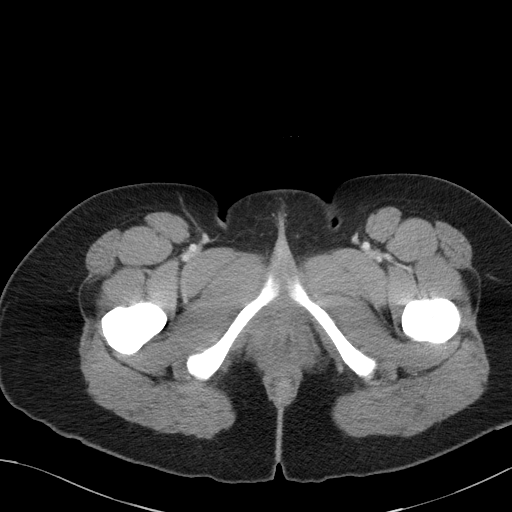
[im 19/95  soft-tissue]
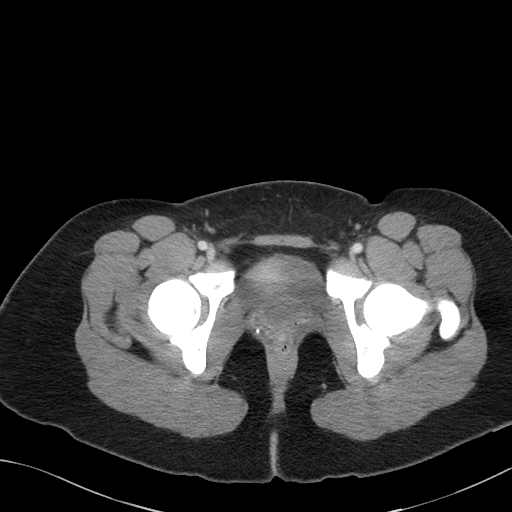
[im 27/95  soft-tissue]
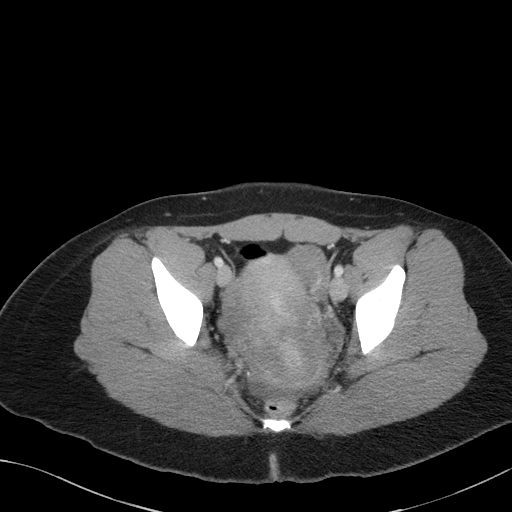
[im 31/95  soft-tissue]
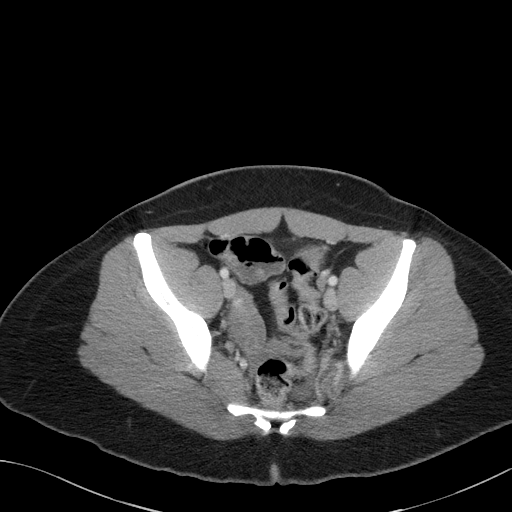
[im 38/95  soft-tissue]
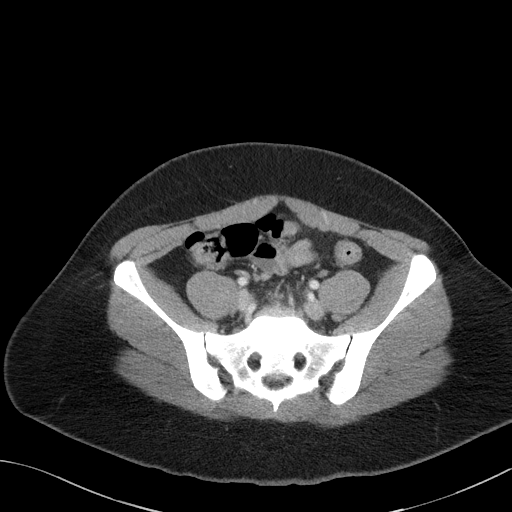
[im 46/95  soft-tissue]
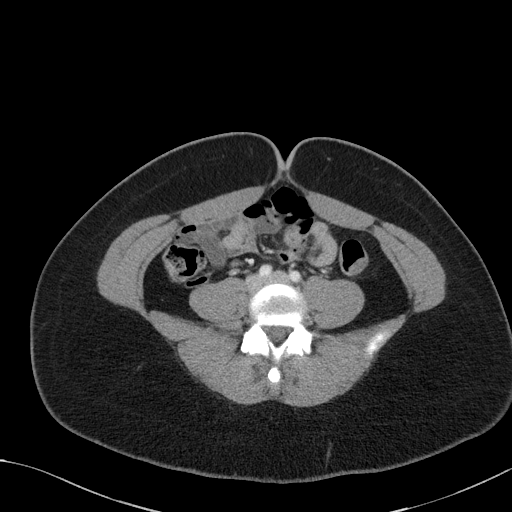
[im 49/95  soft-tissue]
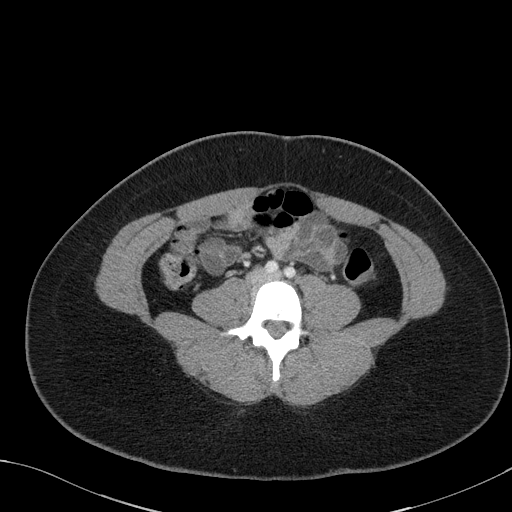
[im 57/95  soft-tissue]
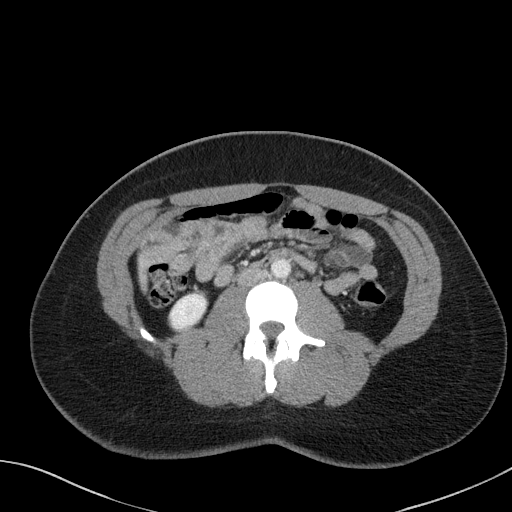
[im 57/95  bone]
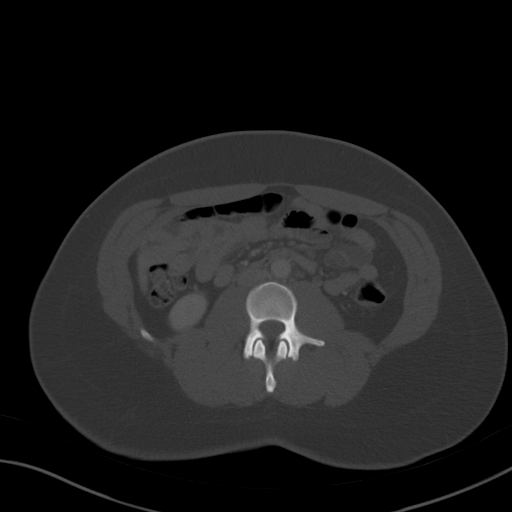
[im 64/95  soft-tissue]
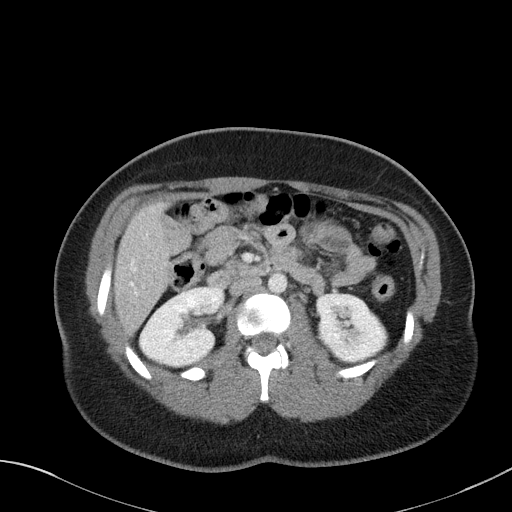
[im 72/95  soft-tissue]
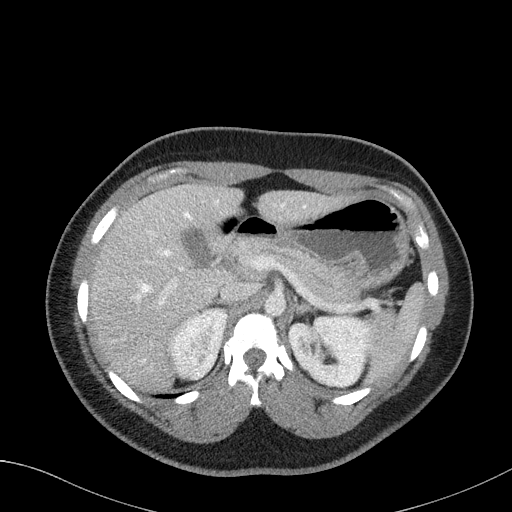
[im 76/95  soft-tissue]
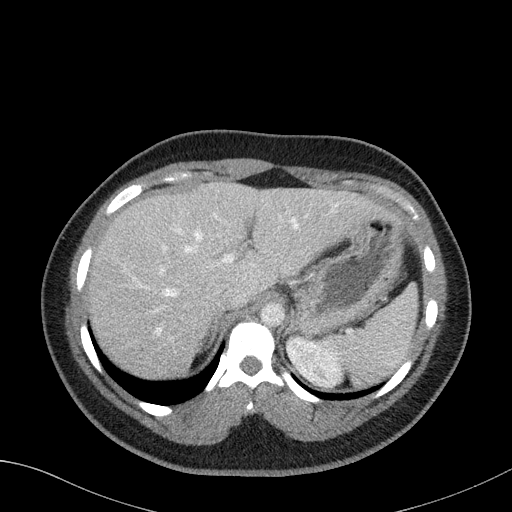
[im 83/95  soft-tissue]
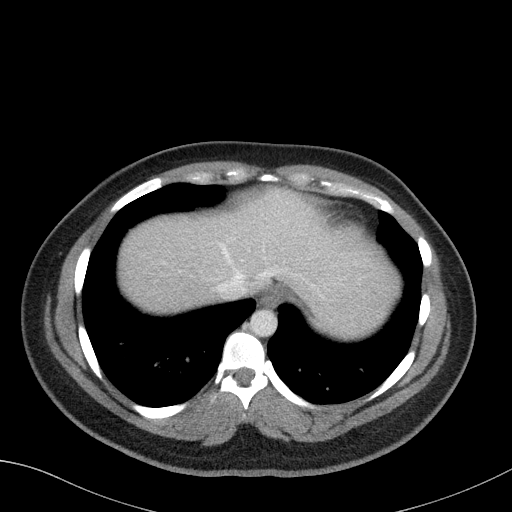
[im 91/95  soft-tissue]
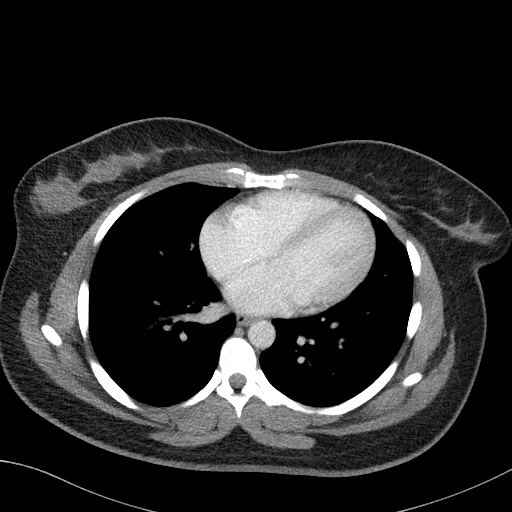

[Series 6: a/p w/ cor · coronal · 0.77mm/px · 3 of 120 slices shown]
[im 40/120  soft-tissue]
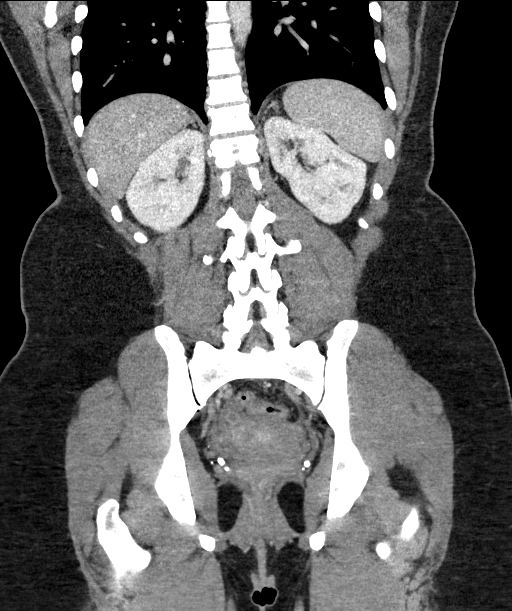
[im 53/120  soft-tissue]
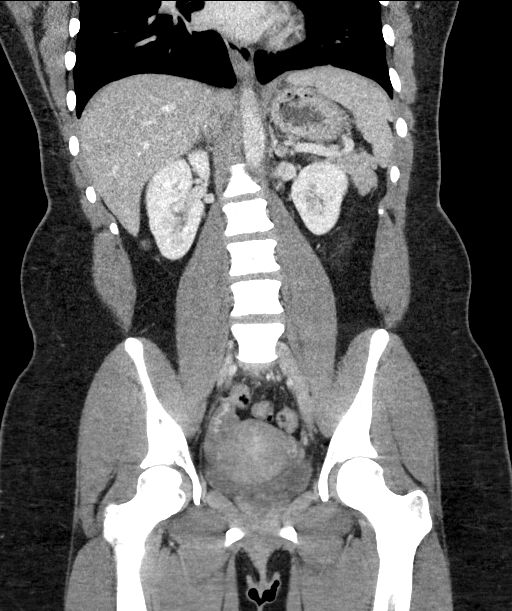
[im 67/120  soft-tissue]
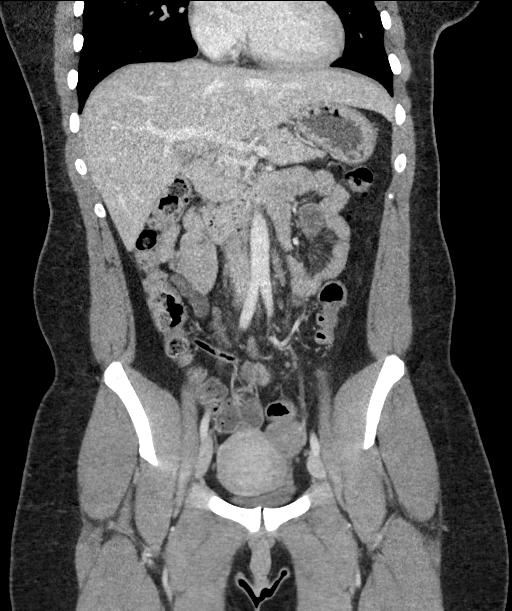

[17 of 46 positions shown; findings below may reference images not displayed]

FINDINGS: Lower chest: No acute abnormality.

Hepatobiliary: No focal liver abnormality is seen. No gallstones,
gallbladder wall thickening, or biliary dilatation.

Pancreas: Unremarkable.  No surrounding inflammatory changes.

Spleen: Normal in size without focal abnormality.

Adrenals/Urinary Tract: Adrenal glands are unremarkable. Kidneys are
normal, without renal calculi, focal lesion, or hydronephrosis.
Bladder is unremarkable.

Stomach/Bowel: Stomach is within normal limits. Appendix appears
normal. No evidence of bowel wall thickening, distention, or
inflammatory changes.

Vascular/Lymphatic: No significant vascular findings are present. No
enlarged abdominal or pelvic lymph nodes.

Reproductive: Uterus and bilateral adnexa are unremarkable.

Other: No abdominal wall hernia or abnormality. No abdominopelvic
ascites.

Musculoskeletal: No acute or significant osseous findings.
IMPRESSION: No acute findings in the abdomen or pelvis.

## 2022-02-11 DIAGNOSIS — Z3483 Encounter for supervision of other normal pregnancy, third trimester: Secondary | ICD-10-CM | POA: Diagnosis not present

## 2022-02-11 DIAGNOSIS — O24419 Gestational diabetes mellitus in pregnancy, unspecified control: Secondary | ICD-10-CM | POA: Diagnosis not present

## 2022-02-11 DIAGNOSIS — O99213 Obesity complicating pregnancy, third trimester: Secondary | ICD-10-CM | POA: Diagnosis not present

## 2022-02-11 DIAGNOSIS — Z3490 Encounter for supervision of normal pregnancy, unspecified, unspecified trimester: Secondary | ICD-10-CM | POA: Diagnosis not present

## 2022-02-11 LAB — OB RESULTS CONSOLE GBS: GBS: NEGATIVE

## 2022-02-16 DIAGNOSIS — O24419 Gestational diabetes mellitus in pregnancy, unspecified control: Secondary | ICD-10-CM | POA: Diagnosis not present

## 2022-02-16 DIAGNOSIS — K30 Functional dyspepsia: Secondary | ICD-10-CM | POA: Diagnosis not present

## 2022-02-16 DIAGNOSIS — O99213 Obesity complicating pregnancy, third trimester: Secondary | ICD-10-CM | POA: Diagnosis not present

## 2022-02-17 ENCOUNTER — Telehealth (HOSPITAL_COMMUNITY): Payer: Self-pay | Admitting: *Deleted

## 2022-02-17 NOTE — Telephone Encounter (Signed)
Preadmission screen  

## 2022-02-19 ENCOUNTER — Encounter (HOSPITAL_COMMUNITY): Payer: Self-pay | Admitting: *Deleted

## 2022-02-24 DIAGNOSIS — O99213 Obesity complicating pregnancy, third trimester: Secondary | ICD-10-CM | POA: Diagnosis not present

## 2022-02-24 DIAGNOSIS — O24419 Gestational diabetes mellitus in pregnancy, unspecified control: Secondary | ICD-10-CM | POA: Diagnosis not present
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
## 2022-03-03 ENCOUNTER — Ambulatory Visit (INDEPENDENT_AMBULATORY_CARE_PROVIDER_SITE_OTHER): Payer: Medicaid Other | Admitting: Licensed Clinical Social Worker

## 2022-03-03 DIAGNOSIS — F3130 Bipolar disorder, current episode depressed, mild or moderate severity, unspecified: Secondary | ICD-10-CM
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
## 2022-03-04 DIAGNOSIS — O24419 Gestational diabetes mellitus in pregnancy, unspecified control: Secondary | ICD-10-CM | POA: Diagnosis not present

## 2022-03-07 ENCOUNTER — Encounter (HOSPITAL_COMMUNITY): Payer: Self-pay | Admitting: Obstetrics and Gynecology

## 2022-03-07 ENCOUNTER — Inpatient Hospital Stay (HOSPITAL_COMMUNITY)
Admission: AD | Admit: 2022-03-07 | Discharge: 2022-03-08 | DRG: 807 | Disposition: A | Discharge status: Home / Self Care | Payer: Medicaid Other | Attending: Obstetrics and Gynecology | Admitting: Obstetrics and Gynecology

## 2022-03-07 ENCOUNTER — Other Ambulatory Visit: Payer: Self-pay

## 2022-03-07 DIAGNOSIS — E282 Polycystic ovarian syndrome: Secondary | ICD-10-CM | POA: Diagnosis present

## 2022-03-07 DIAGNOSIS — Z3A39 39 weeks gestation of pregnancy: Secondary | ICD-10-CM

## 2022-03-07 DIAGNOSIS — O24419 Gestational diabetes mellitus in pregnancy, unspecified control: Principal | ICD-10-CM | POA: Diagnosis present

## 2022-03-07 DIAGNOSIS — O24424 Gestational diabetes mellitus in childbirth, insulin controlled: Principal | ICD-10-CM | POA: Diagnosis present

## 2022-03-07 DIAGNOSIS — O26893 Other specified pregnancy related conditions, third trimester: Secondary | ICD-10-CM | POA: Diagnosis not present

## 2022-03-07 DIAGNOSIS — Z87891 Personal history of nicotine dependence: Secondary | ICD-10-CM | POA: Diagnosis not present

## 2022-03-07 DIAGNOSIS — O99284 Endocrine, nutritional and metabolic diseases complicating childbirth: Secondary | ICD-10-CM | POA: Diagnosis not present

## 2022-03-07 LAB — CBC
HCT: 37.8 % (ref 36.0–46.0)
Hemoglobin: 12.5 g/dL (ref 12.0–15.0)
MCH: 31.8 pg (ref 26.0–34.0)
MCHC: 33.1 g/dL (ref 30.0–36.0)
MCV: 96.2 fL (ref 80.0–100.0)
Platelets: 243 10*3/uL (ref 150–400)
RBC: 3.93 MIL/uL (ref 3.87–5.11)
RDW: 13.5 % (ref 11.5–15.5)
WBC: 11.7 10*3/uL — ABNORMAL HIGH (ref 4.0–10.5)
nRBC: 0 % (ref 0.0–0.2)

## 2022-03-07 LAB — COMPREHENSIVE METABOLIC PANEL
ALT: 16 U/L (ref 0–44)
AST: 20 U/L (ref 15–41)
Albumin: 2.8 g/dL — ABNORMAL LOW (ref 3.5–5.0)
Alkaline Phosphatase: 91 U/L (ref 38–126)
Anion gap: 10 (ref 5–15)
BUN: <5 mg/dL — ABNORMAL LOW (ref 6–20)
CO2: 22 mmol/L (ref 22–32)
Calcium: 8.9 mg/dL (ref 8.9–10.3)
Chloride: 105 mmol/L (ref 98–111)
Creatinine, Ser: 0.68 mg/dL (ref 0.44–1.00)
GFR, Estimated: >60 mL/min (ref >60)
Glucose, Bld: 106 mg/dL — ABNORMAL HIGH (ref 70–99)
Potassium: 4.3 mmol/L (ref 3.5–5.1)
Sodium: 137 mmol/L (ref 135–145)
Total Bilirubin: 0.3 mg/dL (ref 0.3–1.2)
Total Protein: 6.2 g/dL — ABNORMAL LOW (ref 6.5–8.1)

## 2022-03-07 LAB — TYPE AND SCREEN
ABO/RH(D): A POS
Antibody Screen: NEGATIVE

## 2022-03-07 LAB — GLUCOSE, CAPILLARY: Glucose-Capillary: 81 mg/dL (ref 70–99)

## 2022-03-07 LAB — RPR: RPR Ser Ql: NONREACTIVE

## 2022-03-07 LAB — URIC ACID: Uric Acid, Serum: 4.4 mg/dL (ref 2.5–7.1)

## 2022-03-07 MED ORDER — WITCH HAZEL-GLYCERIN EX PADS
1.0000 | MEDICATED_PAD | CUTANEOUS | Status: DC | PRN
  Administered 2022-03-07: 1 via TOPICAL

## 2022-03-07 MED ORDER — ACETAMINOPHEN 325 MG PO TABS: 650.0000 mg | ORAL_TABLET | ORAL | Status: DC | PRN

## 2022-03-07 MED ORDER — SOD CITRATE-CITRIC ACID 500-334 MG/5ML PO SOLN: 30.0000 mL | ORAL | Status: DC | PRN

## 2022-03-07 MED ORDER — EPHEDRINE 5 MG/ML INJ: 10.0000 mg | INTRAVENOUS | Status: DC | PRN

## 2022-03-07 MED ORDER — OXYCODONE-ACETAMINOPHEN 5-325 MG PO TABS: 2.0000 | ORAL_TABLET | ORAL | Status: DC | PRN

## 2022-03-07 MED ORDER — OXYTOCIN-SODIUM CHLORIDE 30-0.9 UT/500ML-% IV SOLN: 1.0000 m[IU]/min | INTRAVENOUS | Status: DC

## 2022-03-07 MED ORDER — TERBUTALINE SULFATE 1 MG/ML IJ SOLN: 0.2500 mg | Freq: Once | INTRAMUSCULAR | Status: DC | PRN

## 2022-03-07 MED ORDER — FENTANYL-BUPIVACAINE-NACL 0.5-0.125-0.9 MG/250ML-% EP SOLN
12.0000 mL/h | EPIDURAL | Status: DC | PRN
  Filled 2022-03-07: qty 250

## 2022-03-07 MED ORDER — OXYTOCIN-SODIUM CHLORIDE 30-0.9 UT/500ML-% IV SOLN
INTRAVENOUS | Status: AC
  Administered 2022-03-07: 2.5 [IU]/h via INTRAVENOUS
  Filled 2022-03-07: qty 500

## 2022-03-07 MED ORDER — OXYTOCIN BOLUS FROM INFUSION
333.0000 mL | Freq: Once | INTRAVENOUS | Status: AC
Start: 2022-03-07 — End: 2022-03-07
  Administered 2022-03-07: 333 mL via INTRAVENOUS

## 2022-03-07 MED ORDER — DIPHENHYDRAMINE HCL 50 MG/ML IJ SOLN: 12.5000 mg | INTRAMUSCULAR | Status: DC | PRN

## 2022-03-07 MED ORDER — DIBUCAINE (PERIANAL) 1 % EX OINT
1.0000 | TOPICAL_OINTMENT | CUTANEOUS | Status: DC | PRN
  Administered 2022-03-07: 1 via RECTAL
  Filled 2022-03-07: qty 28

## 2022-03-07 MED ORDER — OXYCODONE HCL 5 MG PO TABS: 5.0000 mg | ORAL_TABLET | ORAL | Status: DC | PRN

## 2022-03-07 MED ORDER — OXYTOCIN-SODIUM CHLORIDE 30-0.9 UT/500ML-% IV SOLN: 2.5000 [IU]/h | INTRAVENOUS | Status: DC

## 2022-03-07 MED ORDER — LACTATED RINGERS IV SOLN: 500.0000 mL | Freq: Once | INTRAVENOUS | Status: DC

## 2022-03-07 MED ORDER — DIPHENHYDRAMINE HCL 25 MG PO CAPS: 25.0000 mg | ORAL_CAPSULE | Freq: Four times a day (QID) | ORAL | Status: DC | PRN

## 2022-03-07 MED ORDER — PRENATAL MULTIVITAMIN CH
1.0000 | ORAL_TABLET | Freq: Every day | ORAL | Status: DC
  Administered 2022-03-07: 1 via ORAL
  Filled 2022-03-07: qty 1

## 2022-03-07 MED ORDER — FENTANYL CITRATE (PF) 100 MCG/2ML IJ SOLN
50.0000 ug | INTRAMUSCULAR | Status: DC | PRN
  Administered 2022-03-07: 100 ug via INTRAVENOUS
  Filled 2022-03-07: qty 2

## 2022-03-07 MED ORDER — HYDROXYZINE HCL 50 MG PO TABS
50.0000 mg | ORAL_TABLET | Freq: Four times a day (QID) | ORAL | Status: DC | PRN
Start: 2022-03-07 — End: 2022-03-07

## 2022-03-07 MED ORDER — SIMETHICONE 80 MG PO CHEW: 80.0000 mg | CHEWABLE_TABLET | ORAL | Status: DC | PRN

## 2022-03-07 MED ORDER — TETANUS-DIPHTH-ACELL PERTUSSIS 5-2.5-18.5 LF-MCG/0.5 IM SUSY: 0.5000 mL | PREFILLED_SYRINGE | Freq: Once | INTRAMUSCULAR | Status: DC

## 2022-03-07 MED ORDER — OXYCODONE-ACETAMINOPHEN 5-325 MG PO TABS: 1.0000 | ORAL_TABLET | ORAL | Status: DC | PRN

## 2022-03-07 MED ORDER — IBUPROFEN 600 MG PO TABS
600.0000 mg | ORAL_TABLET | Freq: Four times a day (QID) | ORAL | Status: DC
  Administered 2022-03-07 – 2022-03-08 (×5): 600 mg via ORAL
  Filled 2022-03-07 (×5): qty 1

## 2022-03-07 MED ORDER — SENNOSIDES-DOCUSATE SODIUM 8.6-50 MG PO TABS: 2.0000 | ORAL_TABLET | Freq: Every day | ORAL | Status: DC

## 2022-03-07 MED ORDER — LIDOCAINE HCL (PF) 1 % IJ SOLN
30.0000 mL | INTRAMUSCULAR | Status: DC | PRN
Start: 2022-03-07 — End: 2022-03-07

## 2022-03-07 MED ORDER — ONDANSETRON HCL 4 MG/2ML IJ SOLN
4.0000 mg | Freq: Four times a day (QID) | INTRAMUSCULAR | Status: DC | PRN
Start: 2022-03-07 — End: 2022-03-07
  Administered 2022-03-07: 4 mg via INTRAVENOUS
  Filled 2022-03-07: qty 2

## 2022-03-07 MED ORDER — COCONUT OIL OIL: 1.0000 | TOPICAL_OIL | Status: DC | PRN

## 2022-03-07 MED ORDER — PHENYLEPHRINE 80 MCG/ML (10ML) SYRINGE FOR IV PUSH (FOR BLOOD PRESSURE SUPPORT): 80.0000 ug | PREFILLED_SYRINGE | INTRAVENOUS | Status: DC | PRN

## 2022-03-07 MED ORDER — BENZOCAINE-MENTHOL 20-0.5 % EX AERO
1.0000 | INHALATION_SPRAY | CUTANEOUS | Status: DC | PRN
  Administered 2022-03-07: 1 via TOPICAL
  Filled 2022-03-07: qty 56

## 2022-03-07 MED ORDER — INSULIN ASPART 100 UNIT/ML IJ SOLN: 0.0000 [IU] | INTRAMUSCULAR | Status: DC

## 2022-03-07 MED ORDER — ZOLPIDEM TARTRATE 5 MG PO TABS: 5.0000 mg | ORAL_TABLET | Freq: Every evening | ORAL | Status: DC | PRN

## 2022-03-07 MED ORDER — LACTATED RINGERS IV SOLN: 500.0000 mL | INTRAVENOUS | Status: DC | PRN

## 2022-03-07 MED ORDER — PHENYLEPHRINE 80 MCG/ML (10ML) SYRINGE FOR IV PUSH (FOR BLOOD PRESSURE SUPPORT)
80.0000 ug | PREFILLED_SYRINGE | INTRAVENOUS | Status: DC | PRN
  Filled 2022-03-07: qty 10

## 2022-03-07 MED ORDER — OXYCODONE HCL 5 MG PO TABS: 10.0000 mg | ORAL_TABLET | ORAL | Status: DC | PRN

## 2022-03-07 MED ORDER — ONDANSETRON HCL 4 MG PO TABS
4.0000 mg | ORAL_TABLET | ORAL | Status: DC | PRN
  Administered 2022-03-07: 4 mg via ORAL
  Filled 2022-03-07: qty 1

## 2022-03-07 MED ORDER — OXYTOCIN-SODIUM CHLORIDE 30-0.9 UT/500ML-% IV SOLN: 2.5000 [IU]/h | INTRAVENOUS | Status: DC | PRN

## 2022-03-07 MED ORDER — MISOPROSTOL 25 MCG QUARTER TABLET: 25.0000 ug | ORAL_TABLET | ORAL | Status: DC | PRN

## 2022-03-07 MED ORDER — ONDANSETRON HCL 4 MG/2ML IJ SOLN: 4.0000 mg | INTRAMUSCULAR | Status: DC | PRN

## 2022-03-07 MED ORDER — LACTATED RINGERS IV SOLN: INTRAVENOUS | Status: DC

## 2022-03-07 NOTE — Lactation Note (Signed)
This note was copied from a baby's chart. Lactation Consultation Note  Patient Name: Candice Farrell WGNFA'O Date: 03/07/2022 Age:25 hours  LC contacted [facility] and confirmed mother's feeding preference as exclusive formula.   Feeding Mother's Current Feeding Choice: Formula Nipple Type: Slow - flow (green)  Consult Status Consult Status: Complete    Effie Janoski A Higuera Ancidey 03/07/2022, 8:40 AM

## 2022-03-08 ENCOUNTER — Inpatient Hospital Stay (HOSPITAL_COMMUNITY): Payer: Medicaid Other

## 2022-03-08 ENCOUNTER — Inpatient Hospital Stay (HOSPITAL_COMMUNITY)
Admission: AD | Admit: 2022-03-08 | Payer: Medicaid Other | Source: Home / Self Care | Admitting: Obstetrics and Gynecology

## 2022-03-08 LAB — CBC
HCT: 32.3 % — ABNORMAL LOW (ref 36.0–46.0)
Hemoglobin: 10.6 g/dL — ABNORMAL LOW (ref 12.0–15.0)
MCH: 31.4 pg (ref 26.0–34.0)
MCHC: 32.8 g/dL (ref 30.0–36.0)
MCV: 95.6 fL (ref 80.0–100.0)
Platelets: 207 10*3/uL (ref 150–400)
RBC: 3.38 MIL/uL — ABNORMAL LOW (ref 3.87–5.11)
RDW: 13.7 % (ref 11.5–15.5)
WBC: 11.7 10*3/uL — ABNORMAL HIGH (ref 4.0–10.5)
nRBC: 0 % (ref 0.0–0.2)

## 2022-03-08 LAB — GLUCOSE, CAPILLARY: Glucose-Capillary: 76 mg/dL (ref 70–99)

## 2022-03-08 MED ORDER — IBUPROFEN 600 MG PO TABS: 600.0000 mg | ORAL_TABLET | Freq: Four times a day (QID) | ORAL | 1 refills | Status: DC

## 2022-03-08 NOTE — Social Work (Signed)
CSW acknowledged consult and completed a clinical assessment.  There are no barriers to d/c.  Clinical assessment notes will be entered at a later time.  Candice Farrell, MSW, LCSW [facility]  Clinical Social Worker  [phone] 03/08/2022  12:10 PM

## 2022-03-24 ENCOUNTER — Ambulatory Visit (HOSPITAL_COMMUNITY): Payer: Self-pay | Admitting: Licensed Clinical Social Worker

## 2022-04-05 ENCOUNTER — Ambulatory Visit (HOSPITAL_COMMUNITY): Payer: Self-pay | Admitting: Licensed Clinical Social Worker

## 2022-04-06 ENCOUNTER — Telehealth (HOSPITAL_COMMUNITY): Payer: Self-pay | Admitting: Licensed Clinical Social Worker

## 2022-04-19 DIAGNOSIS — Z3202 Encounter for pregnancy test, result negative: Secondary | ICD-10-CM | POA: Diagnosis not present

## 2022-04-19 DIAGNOSIS — O24419 Gestational diabetes mellitus in pregnancy, unspecified control: Secondary | ICD-10-CM | POA: Diagnosis not present

## 2022-04-26 ENCOUNTER — Ambulatory Visit (INDEPENDENT_AMBULATORY_CARE_PROVIDER_SITE_OTHER): Payer: Medicaid Other | Admitting: Licensed Clinical Social Worker

## 2022-04-26 DIAGNOSIS — F3160 Bipolar disorder, current episode mixed, unspecified: Secondary | ICD-10-CM

## 2022-04-26 DIAGNOSIS — F3161 Bipolar disorder, current episode mixed, mild: Secondary | ICD-10-CM | POA: Diagnosis not present

## 2022-04-26 NOTE — Progress Notes (Signed)
   THERAPIST PROGRESS NOTE  Session Time: 50 minutes  Participation Level: {BHH PARTICIPATION LEVEL:22264}  Behavioral Response: {Appearance:22683}{BHH LEVEL OF CONSCIOUSNESS:22305}{BHH MOOD:22306}  Type of Therapy: {CHL AMB BH Type of Therapy:21022741}  Treatment Goals addressed: ***  ProgressTowards Goals: {Progress Towards Goals:21014066}  Interventions: {CHL AMB BH Type of Intervention:21022753}  Summary: Candice Farrell is a 26 y.o. female who presents with ***.   Suicidal/Homicidal: {BHH YES OR NO:22294}{yes/no/with/without intent/plan:22693}  Therapist Response: ***  Plan: Return again in *** weeks.  Diagnosis: Bipolar affective disorder, current episode mixed, current episode severity unspecified (HCC)  Collaboration of Care: {BH OP Collaboration of Care:21014065}  Patient/Guardian was advised Release of Information must be obtained prior to any record release in order to collaborate their care with an outside provider. Patient/Guardian was advised if they have not already done so to contact the registration department to sign all necessary forms in order for [place] to release information regarding their care.   Consent: Patient/Guardian gives verbal consent for treatment and assignment of benefits for services provided during this visit. Patient/Guardian expressed understanding and agreed to proceed.   Wyvonnia Lora, LCSWA 04/26/2022

## 2022-05-11 ENCOUNTER — Encounter (HOSPITAL_COMMUNITY): Payer: Self-pay | Admitting: Psychiatry

## 2022-05-11 ENCOUNTER — Ambulatory Visit (INDEPENDENT_AMBULATORY_CARE_PROVIDER_SITE_OTHER): Payer: Medicaid Other | Admitting: Psychiatry

## 2022-05-11 ENCOUNTER — Other Ambulatory Visit: Payer: Self-pay

## 2022-05-11 VITALS — BP 114/62 | HR 79 | Ht 59.0 in | Wt 172.0 lb

## 2022-05-11 DIAGNOSIS — F411 Generalized anxiety disorder: Secondary | ICD-10-CM | POA: Diagnosis not present

## 2022-05-11 DIAGNOSIS — F900 Attention-deficit hyperactivity disorder, predominantly inattentive type: Secondary | ICD-10-CM

## 2022-05-11 DIAGNOSIS — F4310 Post-traumatic stress disorder, unspecified: Secondary | ICD-10-CM

## 2022-05-11 DIAGNOSIS — F3181 Bipolar II disorder: Secondary | ICD-10-CM | POA: Diagnosis not present

## 2022-05-11 MED ORDER — HYDROXYZINE HCL 10 MG PO TABS
10.0000 mg | ORAL_TABLET | Freq: Three times a day (TID) | ORAL | 3 refills | Status: DC | PRN
  Filled 2022-05-11: qty 90, 30d supply, fill #0

## 2022-05-11 MED ORDER — ATOMOXETINE HCL 25 MG PO CAPS
25.0000 mg | ORAL_CAPSULE | Freq: Every day | ORAL | 3 refills | Status: DC
  Filled 2022-05-11: qty 30, 30d supply, fill #0
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
## 2022-05-12 ENCOUNTER — Other Ambulatory Visit: Payer: Self-pay

## 2022-05-13 ENCOUNTER — Other Ambulatory Visit: Payer: Self-pay

## 2022-05-25 IMAGING — US US OB COMP LESS 14 WK
1 series · 14 of 14 positions shown · non-contrast
Comparison: None.

CLINICAL DATA: Abdominal pain

EXAM:
OBSTETRIC <14 WK ULTRASOUND
TECHNIQUE: Transabdominal ultrasound was performed for evaluation of the
gestation as well as the maternal uterus and adnexal regions.

[Series 1: us ob comp less 14 wk · 14 of 14 slices shown]
[im 1/14]
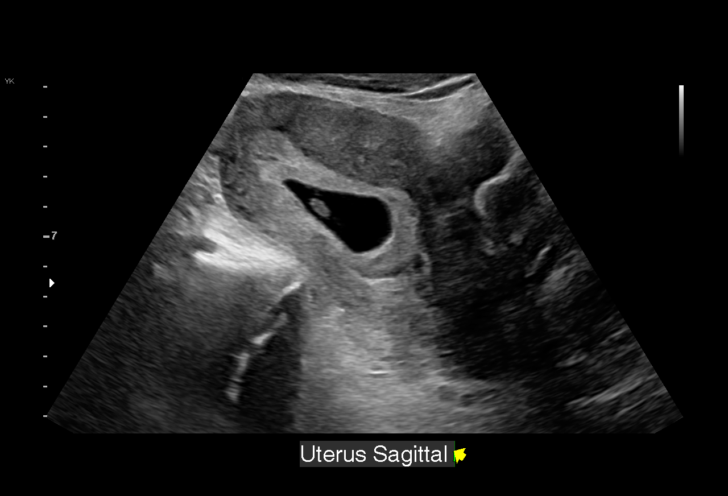
[im 2/14]
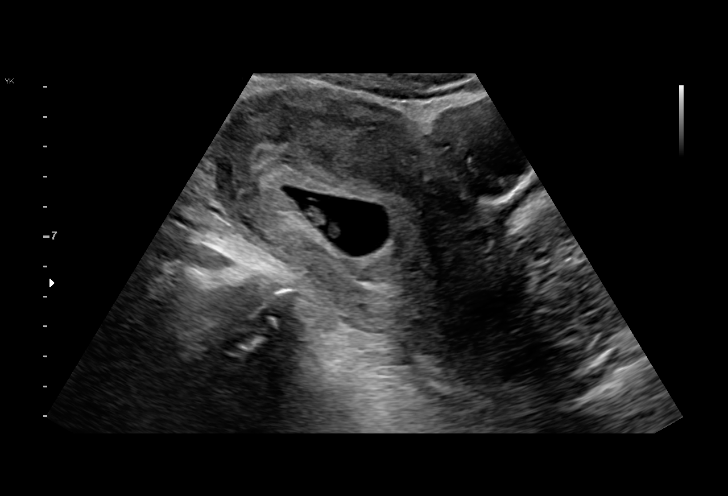
[im 3/14]
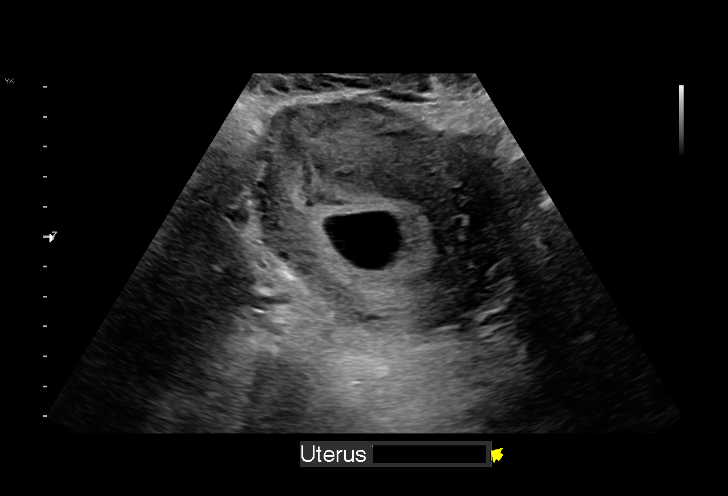
[im 4/14]
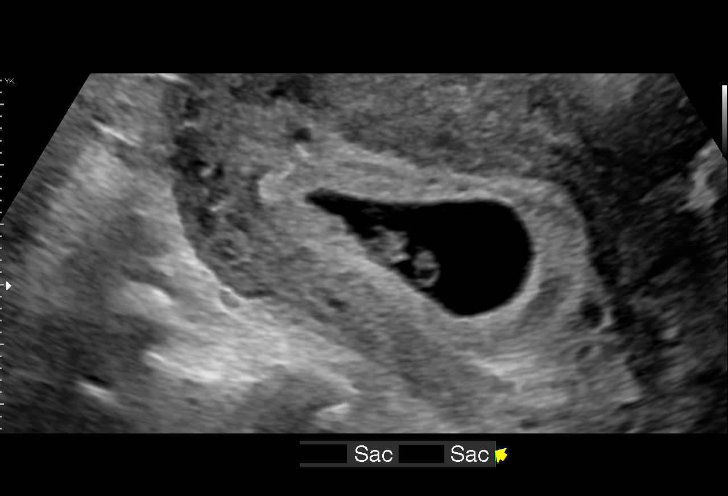
[im 5/14]
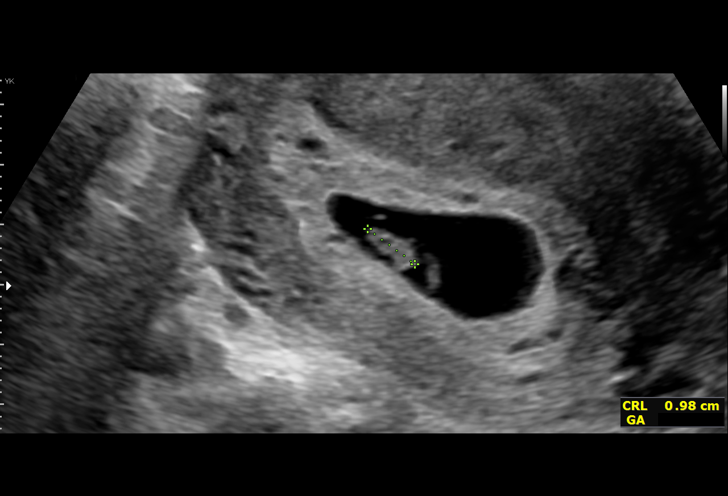
[im 6/14]
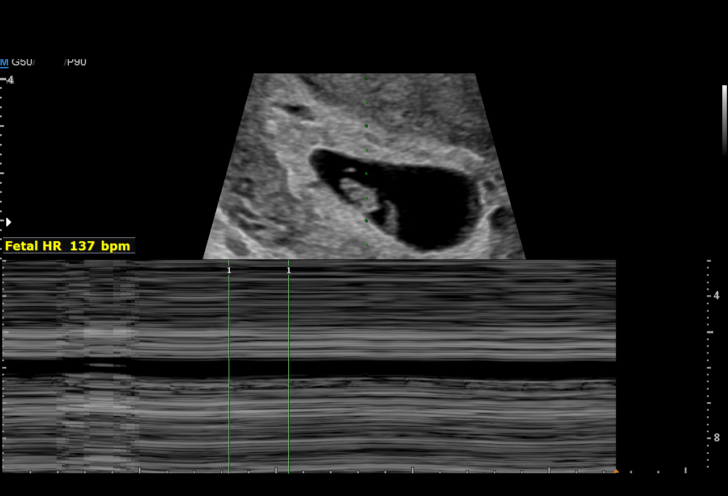
[im 7/14]
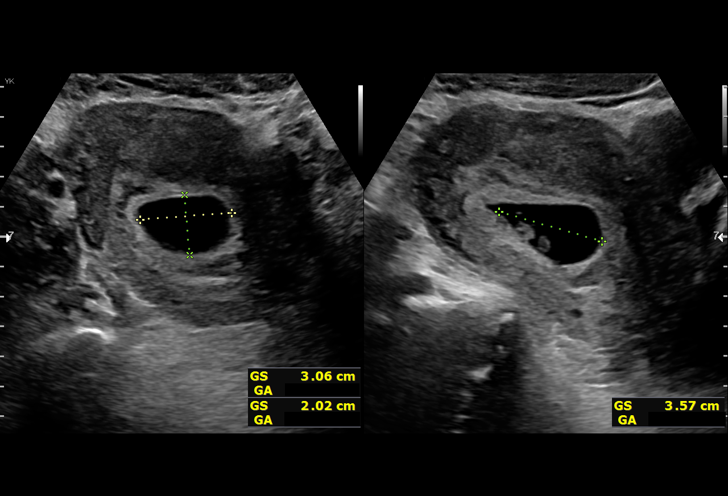
[im 8/14]
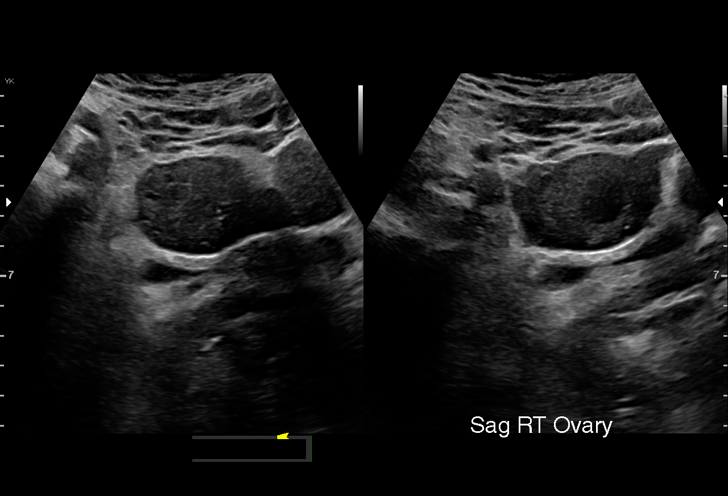
[im 9/14]
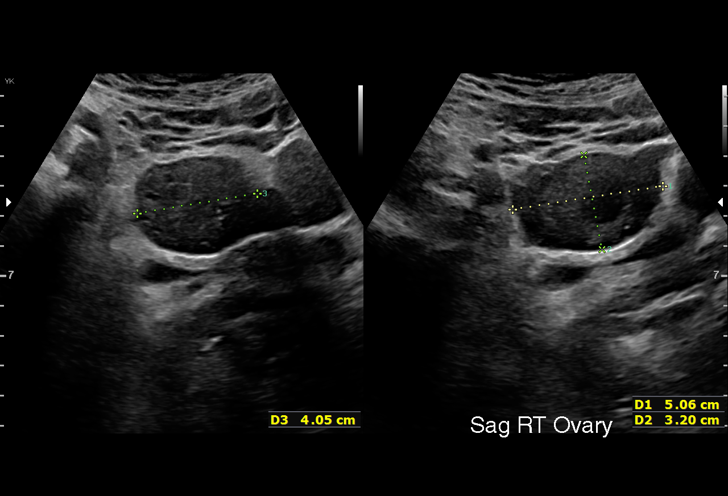
[im 10/14]
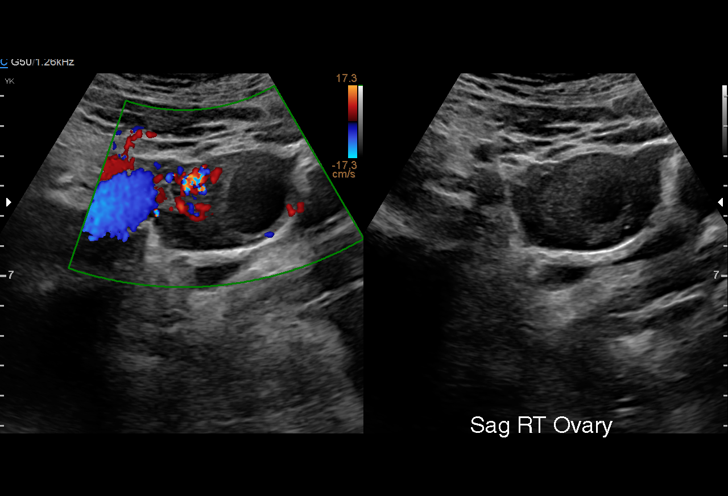
[im 11/14]
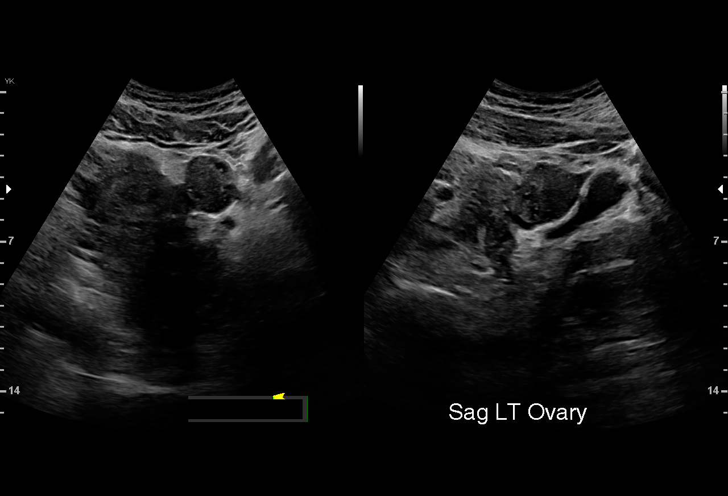
[im 12/14]
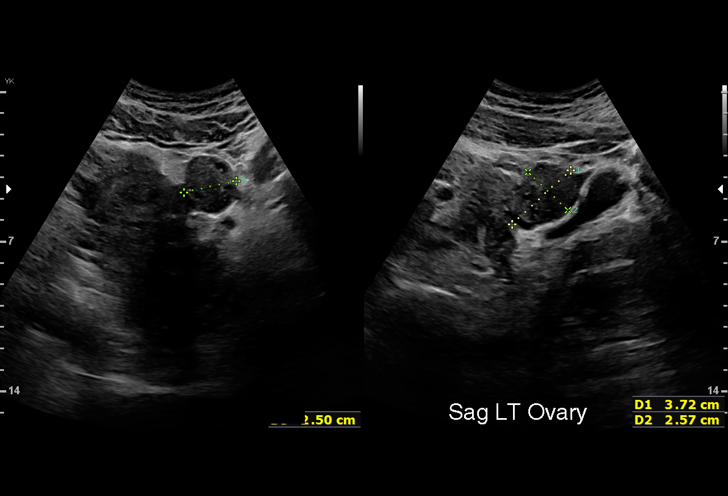
[im 13/14]
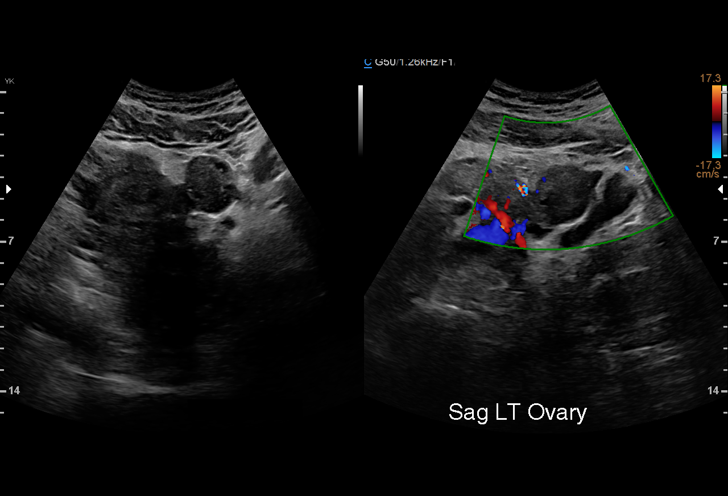
[im 14/14]
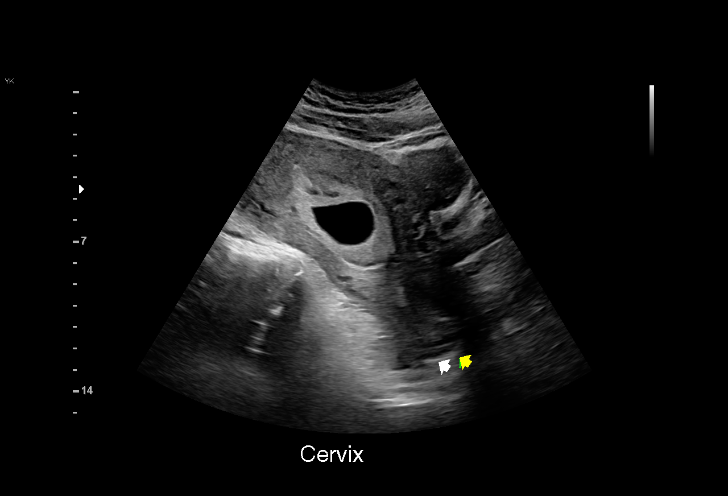

[14 of 14 positions shown; findings below may reference images not displayed]

FINDINGS: Intrauterine gestational sac: Single

Yolk sac:  Visualized.

Embryo:  Visualized.

Cardiac Activity: Visualized.

Heart Rate: 137 bpm

MSD:  28.8 mm   7 w   6 d

CRL:   10 mm   7 w 0 d                  US EDC: 03/14/2022

Subchorionic hemorrhage:  None visualized.

Maternal uterus/adnexae: Unremarkable
IMPRESSION: Single live intrauterine gestation with approximate gestational age
of 7 weeks and 0 days, EDC based on today's sonogram is 03/14/2022.

## 2022-08-03 ENCOUNTER — Other Ambulatory Visit: Payer: Self-pay

## 2022-08-03 ENCOUNTER — Encounter (HOSPITAL_COMMUNITY): Payer: Self-pay | Admitting: Psychiatry

## 2022-08-03 ENCOUNTER — Ambulatory Visit (INDEPENDENT_AMBULATORY_CARE_PROVIDER_SITE_OTHER): Payer: Medicaid Other | Admitting: Psychiatry

## 2022-08-03 ENCOUNTER — Other Ambulatory Visit: Payer: Self-pay | Admitting: Family Medicine

## 2022-08-03 DIAGNOSIS — F411 Generalized anxiety disorder: Secondary | ICD-10-CM

## 2022-08-03 DIAGNOSIS — R109 Unspecified abdominal pain: Secondary | ICD-10-CM

## 2022-08-03 DIAGNOSIS — F900 Attention-deficit hyperactivity disorder, predominantly inattentive type: Secondary | ICD-10-CM

## 2022-08-03 MED ORDER — HYDROXYZINE HCL 25 MG PO TABS
25.0000 mg | ORAL_TABLET | Freq: Three times a day (TID) | ORAL | 3 refills | Status: DC | PRN
  Filled 2022-08-03 – 2022-08-13 (×2): qty 90, 30d supply, fill #0

## 2022-08-03 MED ORDER — ATOMOXETINE HCL 25 MG PO CAPS
25.0000 mg | ORAL_CAPSULE | Freq: Every day | ORAL | 3 refills | Status: DC
  Filled 2022-08-03 – 2022-08-13 (×2): qty 30, 30d supply, fill #0

## 2022-08-03 NOTE — Progress Notes (Signed)
BH MD/PA/NP OP Progress Note Virtual Visit via Telephone Note  I connected with Candice Farrell on 08/03/22 at 10:00 AM EST by telephone and verified that I am speaking with the correct person using two identifiers.  Location: Patient: home Provider: Clinic   I discussed the limitations, risks, security and privacy concerns of performing an evaluation and management service by telephone and the availability of in person appointments. I also discussed with the patient that there may be a patient responsible charge related to this service. The patient expressed understanding and agreed to proceed.   I provided 30 minutes of non-face-to-face time during this encounter.  08/03/2022 1:42 PM Candice Farrell  MRN:  [id number]  Chief Complaint: "I would like to increase the hydroxyzine"  HPI:  26 year old female seen today for follow-up psychiatric evaluation.  She has a psychiatric history of depression, ODD, PTSD, postpartum depression, cannabis use, and bipolar disorder.  Currently she is managed on Strattera 25 mg daily and hydroxyzine 10 mg 3 times daily.  She informed Clinical research associate that her medications are somewhat effective in managing her psychiatric conditions.    Today she was unable to login virtually so her assessment was done on phone.  Today she informed writer that she would like to increase her hydroxyzine.  She notes that she is taking it frequently to help manage her anxiety.  She notes that she worries about her infant son and her 58-year-old daughter.  Provider conducted a GAD-7 and patient scored a 9, at her last visit she scored 18.  Provider also conducted PHQ-9 and she scored an 8, at her last visit she scored a 16.  She endorses adequate sleep and appetite.  Today she denies SI/HI/VAH, mania, paranoia.    Patient informed Clinical research associate that she will be done with her prerequisites at [employer] soon.  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    Today hydroxyzine increased from 10 mg 3 times daily to 25 mg 3 times daily as needed.  She will continue all other medications as prescribed.  No other concerns at this time.    Visit Diagnosis:    ICD-10-CM   1. Attention deficit hyperactivity disorder (ADHD), predominantly inattentive type  F90.0 atomoxetine (STRATTERA) 25 MG capsule    2. Generalized anxiety disorder  F41.1 hydrOXYzine (ATARAX) 25 MG tablet      Past Psychiatric History: Depression, ODD,PTSD, Bipolar disorder   Past Medical History:  Past Medical History:  Diagnosis Date   Anxiety    Back pain    Depression    Gestational diabetes    PCOS (polycystic ovarian syndrome)    PONV (postoperative nausea and vomiting)    Scoliosis     Past Surgical History:  Procedure Laterality Date   BREAST CYST EXCISION     WISDOM TOOTH EXTRACTION      Family Psychiatric History: Paternal grandmother schizophrenia, maternal grand aunt schizophrenia, maternal grand father schizophrenia, mother schizophrenia, paternal great grandfather bipolar disorde   Family History:  Family History  Problem Relation Age of Onset   Diabetes Mother    Deep vein thrombosis Mother    Lupus Mother    Heart disease Father    Breast cancer Paternal Aunt     Social History:  Social History   Socioeconomic History   Marital status: Single    Spouse name: Not on file   Number of children: Not on file  Years of education: Not on file   Highest education level: Not on file  Occupational History   Not on file  Tobacco Use   Smoking status: Former    Packs/day: 0.25    Types: Cigars, Cigarettes   Smokeless tobacco: Never  Vaping Use   Vaping Use: Never used  Substance and Sexual Activity   Alcohol use: Not Currently    Comment: "occ during holidays"   Drug use: Not Currently    Types: Marijuana   Sexual activity: Yes    Birth control/protection: None  Other Topics Concern   Not on file  Social  History Narrative   Not on file   Social Determinants of Health   Financial Resource Strain: Low Risk  (05/22/2019)   Overall Financial Resource Strain (CARDIA)    Difficulty of Paying Living Expenses: Not hard at all  Food Insecurity: No Food Insecurity (05/22/2019)   Hunger Vital Sign    Worried About Running Out of Food in the Last Year: Never true    Ran Out of Food in the Last Year: Never true  Transportation Needs: No Transportation Needs (05/22/2019)   PRAPARE - Administrator, Civil Service (Medical): No    Lack of Transportation (Non-Medical): No  Physical Activity: Not on file  Stress: Not on file  Social Connections: Not on file    Allergies:  Allergies  Allergen Reactions   Morphine And Related Rash    Metabolic Disorder Labs: Lab Results  Component Value Date   HGBA1C 5.5 08/15/2019   MPG 111.15 08/15/2019   No results found for: "PROLACTIN" Lab Results  Component Value Date   CHOL 157 08/15/2019   TRIG 33 08/15/2019   HDL 48 08/15/2019   CHOLHDL 3.3 08/15/2019   VLDL 7 08/15/2019   LDLCALC 102 (H) 08/15/2019   Lab Results  Component Value Date   TSH 1.043 08/15/2019   TSH 1.370 02/14/2018    Therapeutic Level Labs: No results found for: "LITHIUM" No results found for: "VALPROATE" No results found for: "CBMZ"  Current Medications: Current Outpatient Medications  Medication Sig Dispense Refill   atomoxetine (STRATTERA) 25 MG capsule Take 1 capsule (25 mg total) by mouth daily. 30 capsule 3   hydrOXYzine (ATARAX) 25 MG tablet Take 1 tablet (25 mg total) by mouth 3 (three) times daily as needed. 90 tablet 3   ibuprofen (ADVIL) 600 MG tablet Take 1 tablet (600 mg total) by mouth every 6 (six) hours. 30 tablet 1   Prenatal Vit-Fe Fumarate-FA (MULTIVITAMIN-PRENATAL) 27-0.8 MG TABS tablet Take 1 tablet by mouth daily at 12 noon.     No current facility-administered medications for this visit.     Musculoskeletal: Strength & Muscle Tone:   Unable to assess due to telephone visit Gait & Station:  Unable to assess due to telephone visit Patient leans: N/A  Psychiatric Specialty Exam: Review of Systems  unknown if currently breastfeeding.There is no height or weight on file to calculate BMI.  General Appearance:  Unable to assess due to telephone visit  Eye Contact:   Unable to assess due to telephone visit  Speech:  Clear and Coherent and Normal Rate  Volume:  Normal  Mood:  Anxious  Affect:  Appropriate and Congruent  Thought Process:  Coherent, Goal Directed, and Linear  Orientation:  Full (Time, Place, and Person)  Thought Content: WDL and Logical   Suicidal Thoughts:  No  Homicidal Thoughts:  No  Memory:  Immediate;   Good  Recent;   Good Remote;   Good  Judgement:  Good  Insight:  Good  Psychomotor Activity:   Unable to assess due to telephone visit  Concentration:  Concentration: Good and Attention Span: Good  Recall:  Good  Fund of Knowledge: Good  Language: Good  Akathisia:   Unable to assess due to telephone visit  Handed:  Right  AIMS (if indicated): not done  Assets:  Communication Skills Desire for Improvement Financial Resources/Insurance Housing Intimacy Physical Health Social Support  ADL's:  Intact  Cognition: WNL  Sleep:  Good   Screenings: AIMS    Flowsheet Row [facility] Admission (Discharged) from OP Visit from 08/14/2019 [facility]  AIMS Total Score 0 0      AUDIT    Flowsheet Row [facility]  Alcohol Use Disorder Identification Test Final Score (AUDIT) 2      GAD-7    Flowsheet Row Clinical Support from 08/03/2022 in [facility] Office Visit from 05/11/2022 in [facility] Counselor from 01/25/2022 in [facility]  Office Visit from 02/14/2018 in [facility]  Total GAD-7 Score 9 18 14 4       PHQ2-9    Flowsheet Row Clinical Support from 08/03/2022 in [facility] Office Visit from 05/11/2022 in [facility] Counselor from 01/25/2022 in [facility] Nutrition from 03/13/2021 in [facility] Office Visit from 02/14/2018 in [facility]  PHQ-2 Total Score 1 1 4 4 1   PHQ-9 Total Score 8 16 16  -- 6      Flowsheet Row Office Visit from 05/11/2022 in [facility] Admission (Discharged) from 03/07/2022 in [place] [facility] Counselor from 01/25/2022 in [facility]  C-SSRS RISK CATEGORY Error: Q7 should not be populated when Q6 is No No Risk Error: Q3, 4, or 5 should not be populated when Q2 is No        Assessment and Plan: Patient endorses increased anxiety.  She requested that hydroxyzine be increased to help manage this.Today hydroxyzine increased from 10 mg 3 times daily to 25 mg 3 times daily as needed.  She will continue all other medications as prescribed.   Collaboration of Care: Collaboration of Care: Other provider involved in patient's care AEB counselor and PCP  Patient/Guardian was advised Release of Information must be obtained prior to any record release in order to collaborate their care with an outside provider. Patient/Guardian was advised if they have not already done so to contact the registration department to sign all necessary forms in order for [place] to release information regarding their care.   Consent: Patient/Guardian gives verbal consent for treatment and assignment of benefits for services provided during this visit. Patient/Guardian expressed understanding and agreed to proceed.   Follow-up in 3 months Follow-up with therapy   01/27/2022,  NP 08/03/2022, 1:42 PM

## 2022-08-04 ENCOUNTER — Other Ambulatory Visit: Payer: Self-pay

## 2022-08-10 ENCOUNTER — Other Ambulatory Visit: Payer: Self-pay

## 2022-08-11 ENCOUNTER — Other Ambulatory Visit: Payer: Self-pay

## 2022-08-13 ENCOUNTER — Other Ambulatory Visit: Payer: Self-pay

## 2022-08-17 ENCOUNTER — Other Ambulatory Visit: Payer: Medicaid Other

## 2022-08-19 ENCOUNTER — Other Ambulatory Visit: Payer: Self-pay

## 2022-08-26 ENCOUNTER — Ambulatory Visit
Admission: RE | Admit: 2022-08-26 | Discharge: 2022-08-26 | Disposition: A | Discharge status: Home / Self Care | Payer: Medicaid Other | Source: Ambulatory Visit | Attending: Family Medicine | Admitting: Family Medicine

## 2022-08-26 DIAGNOSIS — R109 Unspecified abdominal pain: Secondary | ICD-10-CM

## 2022-08-26 MED ORDER — IOPAMIDOL (ISOVUE-300) INJECTION 61%
100.0000 mL | Freq: Once | INTRAVENOUS | Status: AC | PRN
  Administered 2022-08-26: 100 mL via INTRAVENOUS

## 2022-11-03 ENCOUNTER — Other Ambulatory Visit: Payer: Self-pay

## 2022-11-03 ENCOUNTER — Telehealth (INDEPENDENT_AMBULATORY_CARE_PROVIDER_SITE_OTHER): Payer: Medicaid Other | Admitting: Psychiatry

## 2022-11-03 ENCOUNTER — Encounter (HOSPITAL_COMMUNITY): Payer: Self-pay | Admitting: Psychiatry

## 2022-11-03 DIAGNOSIS — F411 Generalized anxiety disorder: Secondary | ICD-10-CM

## 2022-11-03 DIAGNOSIS — F900 Attention-deficit hyperactivity disorder, predominantly inattentive type: Secondary | ICD-10-CM

## 2022-11-03 DIAGNOSIS — F4321 Adjustment disorder with depressed mood: Secondary | ICD-10-CM | POA: Diagnosis not present

## 2022-11-03 DIAGNOSIS — F321 Major depressive disorder, single episode, moderate: Secondary | ICD-10-CM | POA: Diagnosis not present

## 2022-11-03 MED ORDER — HYDROXYZINE HCL 25 MG PO TABS
25.0000 mg | ORAL_TABLET | Freq: Three times a day (TID) | ORAL | 3 refills | Status: DC | PRN
  Filled 2022-11-03 (×2): qty 90, 30d supply, fill #0

## 2022-11-03 MED ORDER — TRAZODONE HCL 50 MG PO TABS
50.0000 mg | ORAL_TABLET | Freq: Every evening | ORAL | 3 refills | Status: DC | PRN
  Filled 2022-11-03 (×2): qty 30, 30d supply, fill #0

## 2022-11-03 MED ORDER — ATOMOXETINE HCL 25 MG PO CAPS
25.0000 mg | ORAL_CAPSULE | Freq: Every day | ORAL | 3 refills | Status: DC
  Filled 2022-11-03 (×2): qty 30, 30d supply, fill #0

## 2022-11-03 NOTE — Progress Notes (Signed)
BH MD/PA/NP OP Progress Note Virtual Visit via Video Note  I connected with Candice Farrell on 11/03/22 at 11:30 AM EST by a video enabled telemedicine application and verified that I am speaking with the correct person using two identifiers.  Location: Patient: Home Provider: Clinic   I discussed the limitations of evaluation and management by telemedicine and the availability of in person appointments. The patient expressed understanding and agreed to proceed.  I provided 30 minutes of non-face-to-face time during this encounter.    11/03/2022 11:56 AM Candice Farrell  MRN:  [id number]  Chief Complaint: "Mentally I have been maintaining"  HPI:  27 year old female seen today for follow-up psychiatric evaluation.  She has a psychiatric history of depression, ODD, PTSD, postpartum depression, cannabis use, and bipolar disorder.  Currently she is managed on Strattera 25 mg daily and hydroxyzine 25 mg 3 times daily.  She informed Probation officer that her medications are somewhat effective in managing her psychiatric conditions.    Today she was well groomed, pleasant, cooperative, and engaged in conversation. She informed Probation officer that she is mentally maintaining. She notes in 09/18/2023 her grandmother passed away. She notes that this is weighing on her and her family. Patient notes that since the passing of her grandmother she has had poor sleep (5 or less hours). She reports that she has racing thoughts, increased irritability, and fluctuations in mood. Today provider conducted a GAD-7 and patient scored a 17, at her last visit she scored 9.  Provider also conducted PHQ-9 and she scored an 18, at her last visit she scored a 8.  She endorses poor appetite.  Today she endorses passive SI but denies wanting to harm herself.  She denies SI/HI/VAH, mania, paranoia.    Patient reports that her husband, and children are doing well.    Today patient agreeable to starting trazodone 50 mg nightly as needed to help  manage sleep.  She will continue her other medication as prescribed.  She will follow-up with outpatient counseling for therapy.  No other concerns at this time.    Visit Diagnosis:    ICD-10-CM   1. Grief  F43.21 traZODone (DESYREL) 50 MG tablet    2. Attention deficit hyperactivity disorder (ADHD), predominantly inattentive type  F90.0 atomoxetine (STRATTERA) 25 MG capsule    3. Generalized anxiety disorder  F41.1 hydrOXYzine (ATARAX) 25 MG tablet    4. Moderate major depression (HCC)  F32.1 traZODone (DESYREL) 50 MG tablet      Past Psychiatric History: Depression, ODD,PTSD, Bipolar disorder   Past Medical History:  Past Medical History:  Diagnosis Date   Anxiety    Back pain    Depression    Gestational diabetes    PCOS (polycystic ovarian syndrome)    PONV (postoperative nausea and vomiting)    Scoliosis     Past Surgical History:  Procedure Laterality Date   BREAST CYST EXCISION     WISDOM TOOTH EXTRACTION      Family Psychiatric History: Paternal grandmother schizophrenia, maternal grand aunt schizophrenia, maternal grand father schizophrenia, mother schizophrenia, paternal great grandfather bipolar disorde   Family History:  Family History  Problem Relation Age of Onset   Diabetes Mother    Deep vein thrombosis Mother    Lupus Mother    Heart disease Father    Breast cancer Paternal Aunt     Social History:  Social History   Socioeconomic History   Marital status: Single    Spouse name: Not on file   Number  of children: Not on file   Years of education: Not on file   Highest education level: Not on file  Occupational History   Not on file  Tobacco Use   Smoking status: Former    Packs/day: 0.25    Types: Cigars, Cigarettes   Smokeless tobacco: Never  Vaping Use   Vaping Use: Never used  Substance and Sexual Activity   Alcohol use: Not Currently    Comment: "occ during holidays"   Drug use: Not Currently    Types: Marijuana   Sexual activity:  Yes    Birth control/protection: None  Other Topics Concern   Not on file  Social History Narrative   Not on file   Social Determinants of Health   Financial Resource Strain: Low Risk  (05/22/2019)   Overall Financial Resource Strain (CARDIA)    Difficulty of Paying Living Expenses: Not hard at all  Food Insecurity: No Food Insecurity (05/22/2019)   Hunger Vital Sign    Worried About Running Out of Food in the Last Year: Never true    [place] in the Last Year: Never true  Transportation Needs: No Transportation Needs (05/22/2019)   PRAPARE - Hydrologist (Medical): No    Lack of Transportation (Non-Medical): No  Physical Activity: Not on file  Stress: Not on file  Social Connections: Not on file    Allergies:  Allergies  Allergen Reactions   Morphine And Related Rash    Metabolic Disorder Labs: Lab Results  Component Value Date   HGBA1C 5.5 08/15/2019   MPG 111.15 08/15/2019   No results found for: "PROLACTIN" Lab Results  Component Value Date   CHOL 157 08/15/2019   TRIG 33 08/15/2019   HDL 48 08/15/2019   CHOLHDL 3.3 08/15/2019   VLDL 7 08/15/2019   LDLCALC 102 (H) 08/15/2019   Lab Results  Component Value Date   TSH 1.043 08/15/2019   TSH 1.370 02/14/2018    Therapeutic Level Labs: No results found for: "LITHIUM" No results found for: "VALPROATE" No results found for: "CBMZ"  Current Medications: Current Outpatient Medications  Medication Sig Dispense Refill   traZODone (DESYREL) 50 MG tablet Take 1 tablet (50 mg total) by mouth at bedtime as needed for sleep. 30 tablet 3   atomoxetine (STRATTERA) 25 MG capsule Take 1 capsule (25 mg total) by mouth daily. 30 capsule 3   hydrOXYzine (ATARAX) 25 MG tablet Take 1 tablet (25 mg total) by mouth 3 (three) times daily as needed. 90 tablet 3   ibuprofen (ADVIL) 600 MG tablet Take 1 tablet (600 mg total) by mouth every 6 (six) hours. 30 tablet 1   Prenatal Vit-Fe Fumarate-FA  (MULTIVITAMIN-PRENATAL) 27-0.8 MG TABS tablet Take 1 tablet by mouth daily at 12 noon.     No current facility-administered medications for this visit.     Musculoskeletal: Strength & Muscle Tone: within normal limits and telehealth visit Gait & Station: normal, telehealth visit Patient leans: N/A  Psychiatric Specialty Exam: Review of Systems  unknown if currently breastfeeding.There is no height or weight on file to calculate BMI.  General Appearance: Well Groomed  Eye Contact:  Good  Speech:  Clear and Coherent and Normal Rate  Volume:  Normal  Mood:  Anxious and Depressed  Affect:  Appropriate and Congruent  Thought Process:  Coherent, Goal Directed, and Linear  Orientation:  Full (Time, Place, and Person)  Thought Content: WDL and Logical   Suicidal Thoughts:  Yes.  without intent/plan  Homicidal Thoughts:  No  Memory:  Immediate;   Good Recent;   Good Remote;   Good  Judgement:  Good  Insight:  Good  Psychomotor Activity:  Normal  Concentration:  Concentration: Good and Attention Span: Good  Recall:  Good  Fund of Knowledge: Good  Language: Good  Akathisia:  No  Handed:  Right  AIMS (if indicated): not done  Assets:  Communication Skills Desire for Improvement Financial Resources/Insurance Housing Intimacy Physical Health Social Support  ADL's:  Intact  Cognition: WNL  Sleep:  Poor   Screenings: AIMS    Flowsheet Row Admission (Discharged) from 08/15/2019 in [place] [facility] Admission (Discharged) from OP Visit from 08/14/2019 in [place] Total Score 0 0      AUDIT    [place] Admission (Discharged) from 08/15/2019 in [place] [facility]  Alcohol Use Disorder Identification Test Final Score (AUDIT) 2      GAD-7    Flowsheet Row Video Visit from 11/03/2022 in [place] from 08/03/2022 in [facility] Office Visit from 05/11/2022 in [facility] Counselor from 01/25/2022 in [facility] Office Visit from 02/14/2018 in [place] for [facility]  Total GAD-7 Score 17 9 18 14 4      $ PHQ2-9    Flowsheet Row Video Visit from 11/03/2022 in [place] from 08/03/2022 in [facility] Office Visit from 05/11/2022 in [facility] Counselor from 01/25/2022 in [facility] Nutrition from 03/13/2021 in [place] at [facility] Total Score 4 1 1 4 4  $ PHQ-9 Total Score 18 8 16 16 $ --      Flowsheet Row Video Visit from 11/03/2022 in [facility] Office Visit from 05/11/2022 in [facility] Admission (Discharged) from 03/07/2022 in [place] [facility]  C-SSRS RISK CATEGORY Error: Q7 should not be populated when Q6 is No Error: Q7 should not be populated when Q6 is No No Risk        Assessment and Plan: Patient endorses increased anxiety, depression, insomnia, and grief.  Today she is agreeable to starting trazodone 50 mg nightly as needed to help manage anxiety, depression, and sleep.  She will continue her other medications as prescribed and follow-up with outpatient counseling for therapy.  1. Attention deficit hyperactivity disorder (ADHD), predominantly inattentive type  Continue- atomoxetine (STRATTERA) 25 MG capsule; Take 1 capsule (25 mg total) by mouth daily.  Dispense: 30 capsule; Refill: 3  2. Generalized anxiety disorder  Continue- hydrOXYzine (ATARAX) 25 MG tablet; Take 1 tablet (25 mg total) by mouth 3 (three) times daily as needed.  Dispense: 90 tablet; Refill: 3  3. Grief  Start- traZODone (DESYREL) 50 MG tablet; Take 1 tablet (50 mg total) by mouth at bedtime as  needed for sleep.  Dispense: 30 tablet; Refill: 3  4. Moderate major depression ([place])  Start- traZODone (DESYREL) 50 MG tablet; Take 1 tablet (50 mg total) by mouth at bedtime as needed for sleep.  Dispense: 30 tablet; Refill: 3     Follow-up in 3 months Follow-up with therapy   Salley Slaughter, NP 11/03/2022, 11:56 AM

## 2022-11-04 ENCOUNTER — Other Ambulatory Visit: Payer: Self-pay

## 2022-11-18 ENCOUNTER — Other Ambulatory Visit (HOSPITAL_BASED_OUTPATIENT_CLINIC_OR_DEPARTMENT_OTHER): Payer: Self-pay

## 2023-01-14 ENCOUNTER — Encounter (HOSPITAL_COMMUNITY): Payer: Self-pay | Admitting: Psychiatry

## 2023-01-14 ENCOUNTER — Other Ambulatory Visit: Payer: Self-pay

## 2023-01-14 ENCOUNTER — Telehealth (INDEPENDENT_AMBULATORY_CARE_PROVIDER_SITE_OTHER): Payer: Medicaid Other | Admitting: Psychiatry

## 2023-01-14 DIAGNOSIS — F411 Generalized anxiety disorder: Secondary | ICD-10-CM | POA: Diagnosis not present

## 2023-01-14 DIAGNOSIS — F900 Attention-deficit hyperactivity disorder, predominantly inattentive type: Secondary | ICD-10-CM

## 2023-01-14 DIAGNOSIS — F32A Depression, unspecified: Secondary | ICD-10-CM

## 2023-01-14 MED ORDER — ATOMOXETINE HCL 25 MG PO CAPS
25.0000 mg | ORAL_CAPSULE | Freq: Every day | ORAL | 3 refills | Status: DC
  Filled 2023-01-14 – 2023-09-27 (×2): qty 30, 30d supply, fill #0

## 2023-01-14 MED ORDER — TRAZODONE HCL 50 MG PO TABS
50.0000 mg | ORAL_TABLET | Freq: Every evening | ORAL | 3 refills | Status: DC | PRN
  Filled 2023-01-14 – 2023-09-27 (×2): qty 30, 30d supply, fill #0

## 2023-01-14 MED ORDER — HYDROXYZINE HCL 25 MG PO TABS
25.0000 mg | ORAL_TABLET | Freq: Three times a day (TID) | ORAL | 3 refills | Status: DC | PRN
  Filled 2023-01-14 – 2023-09-27 (×2): qty 90, 30d supply, fill #0

## 2023-01-14 NOTE — Progress Notes (Signed)
BH MD/PA/NP OP Progress Note Virtual Visit via Video Note  I connected with Candice Farrell on 01/14/23 at 10:00 AM EDT by a video enabled telemedicine application and verified that I am speaking with the correct person using two identifiers.  Location: Patient: Home Provider: Clinic   I discussed the limitations of evaluation and management by telemedicine and the availability of in person appointments. The patient expressed understanding and agreed to proceed.  I provided 30 minutes of non-face-to-face time during this encounter.    01/14/2023 10:03 AM Candice Farrell  MRN:  [id number]  Chief Complaint: "My medication are working well"  HPI:  27 year old female seen today for follow-up psychiatric evaluation.  She has a psychiatric history of depression, ODD, PTSD, postpartum depression, cannabis use, and bipolar disorder.  Currently she is managed on trazodone 50 mg, Strattera 25 mg daily, and hydroxyzine 25 mg 3 times daily.  She informed Clinical research associate that her medications are effective in managing her psychiatric conditions.    Today she was well groomed, pleasant, cooperative, and engaged in conversation. She informed Clinical research associate that her medications are working well.  She informed Clinical research associate that her sleep has improved since her last visit noting that she sleeps 6 or more hours nightly.  Patient also informed writer that her anxiety and depression has improved.  Today provider conducted a GAD-7 and patient scored a 6, at her last visit she scored 17.  Provider also conducted PHQ 9 of a score of 4, her life is she scored 18.  She endorses adequate appetite.  Today she denies SI/HI/VH, mania, paranoia.    Patient informed Clinical research associate that she has been working at a rehabilitation facility.  She notes that she works third shift and finds enjoyment in her job.  She reports that her husband and 2 children are also doing well.    No medication changes made today.  Patient agreeable to continue medication as  prescribed.  She will follow-up with outpatient counseling for therapy.  No other concerns at this time.    Visit Diagnosis:    ICD-10-CM   1. Mild depression  F32.A traZODone (DESYREL) 50 MG tablet    2. Attention deficit hyperactivity disorder (ADHD), predominantly inattentive type  F90.0 atomoxetine (STRATTERA) 25 MG capsule    3. Generalized anxiety disorder  F41.1 hydrOXYzine (ATARAX) 25 MG tablet      Past Psychiatric History: Depression, ODD,PTSD, Bipolar disorder   Past Medical History:  Past Medical History:  Diagnosis Date   Anxiety    Back pain    Depression    Gestational diabetes    PCOS (polycystic ovarian syndrome)    PONV (postoperative nausea and vomiting)    Scoliosis     Past Surgical History:  Procedure Laterality Date   BREAST CYST EXCISION     WISDOM TOOTH EXTRACTION      Family Psychiatric History: Paternal grandmother schizophrenia, maternal grand aunt schizophrenia, maternal grand father schizophrenia, mother schizophrenia, paternal great grandfather bipolar disorde   Family History:  Family History  Problem Relation Age of Onset   Diabetes Mother    Deep vein thrombosis Mother    Lupus Mother    Heart disease Father    Breast cancer Paternal Aunt     Social History:  Social History   Socioeconomic History   Marital status: Single    Spouse name: Not on file   Number of children: Not on file   Years of education: Not on file   Highest education level: Not on  file  Occupational History   Not on file  Tobacco Use   Smoking status: Former    Packs/day: .25    Types: Cigars, Cigarettes   Smokeless tobacco: Never  Vaping Use   Vaping Use: Never used  Substance and Sexual Activity   Alcohol use: Not Currently    Comment: "occ during holidays"   Drug use: Not Currently    Types: Marijuana   Sexual activity: Yes    Birth control/protection: None  Other Topics Concern   Not on file  Social History Narrative   Not on file    Social Determinants of Health   Financial Resource Strain: Low Risk  (05/22/2019)   Overall Financial Resource Strain (CARDIA)    Difficulty of Paying Living Expenses: Not hard at all  Food Insecurity: No Food Insecurity (05/22/2019)   Hunger Vital Sign    Worried About Running Out of Food in the Last Year: Never true    Ran Out of Food in the Last Year: Never true  Transportation Needs: No Transportation Needs (05/22/2019)   PRAPARE - Administrator, Civil Service (Medical): No    Lack of Transportation (Non-Medical): No  Physical Activity: Not on file  Stress: Not on file  Social Connections: Not on file    Allergies:  Allergies  Allergen Reactions   Morphine And Related Rash    Metabolic Disorder Labs: Lab Results  Component Value Date   HGBA1C 5.5 08/15/2019   MPG 111.15 08/15/2019   No results found for: "PROLACTIN" Lab Results  Component Value Date   CHOL 157 08/15/2019   TRIG 33 08/15/2019   HDL 48 08/15/2019   CHOLHDL 3.3 08/15/2019   VLDL 7 08/15/2019   LDLCALC 102 (H) 08/15/2019   Lab Results  Component Value Date   TSH 1.043 08/15/2019   TSH 1.370 02/14/2018    Therapeutic Level Labs: No results found for: "LITHIUM" No results found for: "VALPROATE" No results found for: "CBMZ"  Current Medications: Current Outpatient Medications  Medication Sig Dispense Refill   atomoxetine (STRATTERA) 25 MG capsule Take 1 capsule (25 mg total) by mouth daily. 30 capsule 3   hydrOXYzine (ATARAX) 25 MG tablet Take 1 tablet (25 mg total) by mouth 3 (three) times daily as needed. 90 tablet 3   ibuprofen (ADVIL) 600 MG tablet Take 1 tablet (600 mg total) by mouth every 6 (six) hours. 30 tablet 1   Prenatal Vit-Fe Fumarate-FA (MULTIVITAMIN-PRENATAL) 27-0.8 MG TABS tablet Take 1 tablet by mouth daily at 12 noon.     traZODone (DESYREL) 50 MG tablet Take 1 tablet (50 mg total) by mouth at bedtime as needed for sleep. 30 tablet 3   No current  facility-administered medications for this visit.     Musculoskeletal: Strength & Muscle Tone: within normal limits and telehealth visit Gait & Station: normal, telehealth visit Patient leans: N/A  Psychiatric Specialty Exam: Review of Systems  unknown if currently breastfeeding.There is no height or weight on file to calculate BMI.  General Appearance: Well Groomed  Eye Contact:  Good  Speech:  Clear and Coherent and Normal Rate  Volume:  Normal  Mood:  Euthymic  Affect:  Appropriate and Congruent  Thought Process:  Coherent, Goal Directed, and Linear  Orientation:  Full (Time, Place, and Person)  Thought Content: WDL and Logical   Suicidal Thoughts:  No  Homicidal Thoughts:  No  Memory:  Immediate;   Good Recent;   Good Remote;   Good  Judgement:  Good  Insight:  Good  Psychomotor Activity:  Normal  Concentration:  Concentration: Good and Attention Span: Good  Recall:  Good  Fund of Knowledge: Good  Language: Good  Akathisia:  No  Handed:  Right  AIMS (if indicated): not done  Assets:  Communication Skills Desire for Improvement Financial Resources/Insurance Housing Intimacy Physical Health Social Support  ADL's:  Intact  Cognition: WNL  Sleep:  Good   Screenings: AIMS    Flowsheet Row [facility] Admission (Discharged) from OP Visit from 08[facility]14/2019 [facility]  AIMS Total Score 0 0      AUDIT    Flowsheet Row [facility]  Alcohol Use Disorder Identification Test Final Score (AUDIT) 2      GAD-7    Flowsheet Row Video Visit from 01/14/2023 in [facility] Video Visit from 11/03/2022 in [facility] Clinical Support from 08/03/2022 in [facility] Office Visit from 05/11/2022 in [facility] Counselor from 01/25/2022 in [facility]  Total GAD-7 Score 6 17 9 18 14       PHQ2-9    Flowsheet Row Video Visit from 01/14/2023 in [facility] Video Visit from 11/03/2022 in [facility] Clinical Support from 08/03/2022 in [facility] Office Visit from 05/11/2022 in [facility] Counselor from 01/25/2022 in [facility]  PHQ-2 Total Score 0 4 1 1 4   PHQ-9 Total Score 4 18 8 16 16       Flowsheet Row Video Visit from 11/03/2022 in [facility] Office Visit from 05/11/2022 in [facility] Admission (Discharged) from 03/07/2022 in [facility] Mother Baby Unit  C-SSRS RISK CATEGORY Error: Q7 should not be populated when Q6 is No Error: Q7 should not be populated when Q6 is No No Risk        Assessment and Plan: Patient reports that her anxiety and depression are well-managed.  She also notes that her sleep has improved.  At times she notes that she has problems concentrating but informed writer that these are on days where she misses her Strattera.  No medication changes made today.  Patient agreeable to continue medication as prescribed.   1. Attention deficit hyperactivity disorder (ADHD), predominantly inattentive type  Continue- atomoxetine (STRATTERA) 25 MG capsule; Take 1 capsule (25 mg total) by mouth daily.  Dispense: 30 capsule; Refill: 3  2. Generalized anxiety disorder  Continue- hydrOXYzine (ATARAX) 25 MG tablet; Take 1 tablet (25 mg total) by mouth 3 (three) times daily as needed.  Dispense: 90 tablet; Refill: 3  3. Mild depression  Continue- traZODone (DESYREL) 50 MG tablet; Take 1 tablet (50 mg total) by mouth at bedtime as needed for sleep.  Dispense: 30 tablet; Refill: 3      Follow-up in 2.5 months Follow-up with  therapy   Shanna Cisco, NP 01/14/2023, 10:03 AM

## 2023-01-19 ENCOUNTER — Telehealth (HOSPITAL_COMMUNITY): Payer: Self-pay | Admitting: Licensed Clinical Social Worker

## 2023-01-20 ENCOUNTER — Other Ambulatory Visit: Payer: Self-pay

## 2023-01-24 ENCOUNTER — Ambulatory Visit (HOSPITAL_COMMUNITY): Payer: Medicaid Other | Admitting: Licensed Clinical Social Worker

## 2023-01-31 ENCOUNTER — Ambulatory Visit (INDEPENDENT_AMBULATORY_CARE_PROVIDER_SITE_OTHER): Payer: Medicaid Other | Admitting: Licensed Clinical Social Worker

## 2023-01-31 ENCOUNTER — Encounter (HOSPITAL_COMMUNITY): Payer: Self-pay

## 2023-01-31 DIAGNOSIS — F4310 Post-traumatic stress disorder, unspecified: Secondary | ICD-10-CM

## 2023-01-31 DIAGNOSIS — F3181 Bipolar II disorder: Secondary | ICD-10-CM

## 2023-01-31 DIAGNOSIS — F411 Generalized anxiety disorder: Secondary | ICD-10-CM

## 2023-01-31 NOTE — Treatment Plan (Signed)
Active     BH CCP Acute or Chronic Trauma Reaction     LTG: Elimination of maladaptive behaviors and thinking patterns which interfere with resolution of trauma as evidenced by self-report (Initial)     Start:  01/31/23    Expected End:  08/03/23         LTG: Develop and implement effective coping skills to carry out normal responsibilities and participate constructively in relationships (Initial)     Start:  01/31/23    Expected End:  08/03/23         STG: Candice Farrell will complete at least 80% of assigned homework  (Initial)     Start:  01/31/23    Expected End:  08/03/23         STG: Increase participation in previously avoided activities such as sending children to daycare (Initial)     Start:  01/31/23    Expected End:  08/03/23         STG: Candice Farrell will identify internal and external stimuli that trigger PTSD symptoms (Initial)     Start:  01/31/23    Expected End:  08/03/23         STG: Candice Farrell will acknowledge that healing from PTSD is a gradual process (Initial)     Start:  01/31/23    Expected End:  08/03/23         STG: Candice Farrell will identify coping strategies to deal with trauma memories and the associated emotional reaction (Initial)     Start:  01/31/23    Expected End:  08/03/23         STG: Candice Farrell will verbalize an increased sense of mastery over PTSD symptoms by using several techniques to cope with flashbacks, decrease the power of triggers, and decrease negative thinking (Initial)     Start:  01/31/23    Expected End:  08/03/23         STG: Candice Farrell will follow a medication regimen as recommended by the provider and report any side effects that are experienced (Initial)     Start:  01/31/23    Expected End:  08/03/23
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
## 2023-02-21 ENCOUNTER — Ambulatory Visit (HOSPITAL_COMMUNITY): Payer: Medicaid Other | Admitting: Licensed Clinical Social Worker

## 2023-02-28 ENCOUNTER — Encounter (HOSPITAL_COMMUNITY): Payer: Self-pay

## 2023-02-28 ENCOUNTER — Ambulatory Visit (HOSPITAL_COMMUNITY): Payer: Medicaid Other | Admitting: Licensed Clinical Social Worker

## 2023-02-28 ENCOUNTER — Telehealth (HOSPITAL_COMMUNITY): Payer: Self-pay | Admitting: Licensed Clinical Social Worker

## 2023-02-28 NOTE — Telephone Encounter (Signed)
Cln signed on at 1:00 pm, sent text link for video session per schedule, and remained online for session until 1:10 pm. Pt failed to sign on for session.

## 2023-03-25 ENCOUNTER — Encounter (HOSPITAL_COMMUNITY): Payer: Self-pay

## 2023-03-25 ENCOUNTER — Telehealth (HOSPITAL_COMMUNITY): Payer: MEDICAID | Admitting: Psychiatry

## 2023-06-03 ENCOUNTER — Other Ambulatory Visit: Payer: Self-pay

## 2023-09-27 ENCOUNTER — Other Ambulatory Visit: Payer: Self-pay

## 2023-09-29 ENCOUNTER — Other Ambulatory Visit: Payer: Self-pay

## 2023-10-06 ENCOUNTER — Other Ambulatory Visit: Payer: Self-pay

## 2023-10-21 ENCOUNTER — Encounter: Payer: Self-pay | Admitting: Nurse Practitioner

## 2023-10-21 ENCOUNTER — Ambulatory Visit (INDEPENDENT_AMBULATORY_CARE_PROVIDER_SITE_OTHER): Payer: MEDICAID | Admitting: Nurse Practitioner

## 2023-10-21 VITALS — BP 120/67 | HR 78 | Temp 97.6°F | Ht 59.0 in | Wt 179.0 lb

## 2023-10-21 DIAGNOSIS — F4310 Post-traumatic stress disorder, unspecified: Secondary | ICD-10-CM | POA: Diagnosis not present

## 2023-10-21 DIAGNOSIS — K760 Fatty (change of) liver, not elsewhere classified: Secondary | ICD-10-CM

## 2023-10-21 DIAGNOSIS — F900 Attention-deficit hyperactivity disorder, predominantly inattentive type: Secondary | ICD-10-CM | POA: Diagnosis not present

## 2023-10-21 DIAGNOSIS — F32A Depression, unspecified: Secondary | ICD-10-CM

## 2023-10-21 DIAGNOSIS — Z1329 Encounter for screening for other suspected endocrine disorder: Secondary | ICD-10-CM

## 2023-10-21 DIAGNOSIS — Z113 Encounter for screening for infections with a predominantly sexual mode of transmission: Secondary | ICD-10-CM

## 2023-10-21 DIAGNOSIS — Z13228 Encounter for screening for other metabolic disorders: Secondary | ICD-10-CM

## 2023-10-21 DIAGNOSIS — M549 Dorsalgia, unspecified: Secondary | ICD-10-CM

## 2023-10-21 DIAGNOSIS — Z1321 Encounter for screening for nutritional disorder: Secondary | ICD-10-CM

## 2023-10-21 DIAGNOSIS — R7303 Prediabetes: Secondary | ICD-10-CM

## 2023-10-21 DIAGNOSIS — E669 Obesity, unspecified: Secondary | ICD-10-CM | POA: Insufficient documentation

## 2023-10-21 DIAGNOSIS — F411 Generalized anxiety disorder: Secondary | ICD-10-CM | POA: Diagnosis not present

## 2023-10-21 DIAGNOSIS — Z720 Tobacco use: Secondary | ICD-10-CM

## 2023-10-21 DIAGNOSIS — R1032 Left lower quadrant pain: Secondary | ICD-10-CM

## 2023-10-21 DIAGNOSIS — N760 Acute vaginitis: Secondary | ICD-10-CM

## 2023-10-21 DIAGNOSIS — Z130 Encounter for screening for diseases of the blood and blood-forming organs and certain disorders involving the immune mechanism: Secondary | ICD-10-CM

## 2023-10-21 HISTORY — DX: Dorsalgia, unspecified: M54.9

## 2023-10-21 HISTORY — DX: Prediabetes: R73.03

## 2023-10-21 MED ORDER — ATOMOXETINE HCL 25 MG PO CAPS: 25.0000 mg | ORAL_CAPSULE | Freq: Every day | ORAL | 0 refills | Status: DC

## 2023-10-21 MED ORDER — TRAZODONE HCL 50 MG PO TABS: 50.0000 mg | ORAL_TABLET | Freq: Every evening | ORAL | 0 refills | Status: DC | PRN

## 2023-10-21 NOTE — Assessment & Plan Note (Signed)
 Strattera  25 mg daily refilled Encouraged to reschedule her missed appointment with a therapist and psychiatrist

## 2023-10-21 NOTE — Assessment & Plan Note (Signed)
 Patient encouraged to lose weight She is not interested in starting medication for obesity

## 2023-10-21 NOTE — Assessment & Plan Note (Signed)
 Currently denies back pain Continue use of heating pad as needed Stretching exercises also encouraged Can take OTC Tylenol  or ibuprofen  as needed

## 2023-10-21 NOTE — Patient Instructions (Signed)
 Please consider getting influenza  and HPV vaccine at local pharmacy.     1. Post traumatic stress disorder (PTSD) (Primary)   2. Attention deficit hyperactivity disorder (ADHD), predominantly inattentive type  - atomoxetine  (STRATTERA ) 25 MG capsule; Take 1 capsule (25 mg total) by mouth daily.  Dispense: 60 capsule; Refill: 0  3. Mild depression  - traZODone  (DESYREL ) 50 MG tablet; Take 1 tablet (50 mg total) by mouth at bedtime as needed for sleep.  Dispense: 60 tablet; Refill: 0  4. Fatty liver   5. Obesity (BMI 30-39.9)  - Amb Ref to Medical Weight Management  6. Left lower quadrant abdominal pain   7. Screening for endocrine, nutritional, metabolic and immunity disorder  - CBC - CMP14+EGFR  8. Prediabetes  - Hemoglobin A1c     It is important that you exercise regularly at least 30 minutes 5 times a week as tolerated  Think about what you will eat, plan ahead. Choose  clean, green, fresh or frozen over canned, processed or packaged foods which are more sugary, salty and fatty. 70 to 75% of food eaten should be vegetables and fruit. Three meals at set times with snacks allowed between meals, but they must be fruit or vegetables. Aim to eat over a 12 hour period , example 7 am to 7 pm, and STOP after  your last meal of the day. Drink water ,generally about 64 ounces per day, no other drink is as healthy. Fruit juice is best enjoyed in a healthy way, by EATING the fruit.  Thanks for choosing [facility] we consider it a privelige to serve you.

## 2023-10-21 NOTE — Progress Notes (Signed)
 New Patient Office Visit  Subjective:  Patient ID: Candice Farrell, female    DOB: Aug 28, 1996  Age: 28 y.o. MRN: [id number]  CC:  Chief Complaint  Patient presents with   Abdominal Pain    Radiating to back      HPI Candice Farrell is a 28 y.o. female  has a past medical history of Anxiety, Back pain, Depression, Gestational diabetes, PCOS (polycystic ovarian syndrome), PONV (postoperative nausea and vomiting), and Scoliosis.  Patient presents to establish care for her chronic medical conditions.  Previous PCP was at [facility] physician, last visit today was in August 2024  Obesity.  States that she works out 5 days a week, she has also been trying to follow a low-carb diet and doing portion control.  She is not interested in medication for weight loss.  Stated that she was on metformin for prediabetes but she stopped taking the medication due to abdominal pain.  Chronic abdominal pain.  Patient complains of chronic intermittent abdominal pain described as a soreness.  States that she has some nausea when the pain is bad.  She has had an abdominal CT and a pelvic ultrasound done by her previous PCP and OB/GYN.  Stated that the abdominal CT was normal except for mild  hepatic steatosis, ovarian cyst was noted on the pelvic ultrasound.  She denies known triggers for her abdominal pain.  Takes Tums as needed for acid reflux which she said is not bad.  Tobacco use disorder.  Smokes Black and mild daily, no complaints of cough, shortness of breath, wheezing.  Acute vaginitis patient complains of vaginal itching, white-colored vaginal discharge for about a week.  Currently denies abdominal pain, nausea, vomiting, dysuria, vaginal rashes.  Back pain patient complains of chronic intermittent back pain that occurs with bending over, stated that heating pad helps her pain.  Pain usually resolves without taking medications  PTSD/ADHD/anxiety.  She is established with psychiatrist, also goes for counseling.   Stated she missed her last appointment with a psychiatrist.  Needs refill for her medications.  Current denies SI, HI  Last Pap smear was in 2023 done at [facility].  Records requested.   Past Medical History:  Diagnosis Date   Anxiety    Back pain    Depression    Gestational diabetes    PCOS (polycystic ovarian syndrome)    PONV (postoperative nausea and vomiting)    Scoliosis     Past Surgical History:  Procedure Laterality Date   BREAST CYST EXCISION     WISDOM TOOTH EXTRACTION      Family History  Problem Relation Age of Onset   Diabetes Mother    Deep vein thrombosis Mother    Lupus Mother    Heart disease Father    Breast cancer Paternal Aunt    Heart failure Paternal Grandmother     Social History   Socioeconomic History   Marital status: Married    Spouse name: Not on file   Number of children: 2   Years of education: Not on file   Highest education level: Not on file  Occupational History   Not on file  Tobacco Use   Smoking status: Every Day    Types: Cigars   Smokeless tobacco: Never  Vaping Use   Vaping status: Never Used  Substance and Sexual Activity   Alcohol use: Not Currently    Comment: occ during holidays   Drug use: Not Currently    Types: Marijuana   Sexual  activity: Yes    Birth control/protection: None  Other Topics Concern   Not on file  Social History Narrative   Lives with her husband    Social Drivers of Health   Financial Resource Strain: Low Risk  (05/22/2019)   Overall Financial Resource Strain (CARDIA)    Difficulty of Paying Living Expenses: Not hard at all  Food Insecurity: No Food Insecurity (05/22/2019)   Hunger Vital Sign    Worried About Running Out of Food in the Last Year: Never true    Ran Out of Food in the Last Year: Never true  Transportation Needs: No Transportation Needs (05/22/2019)   PRAPARE - Administrator, Civil Service (Medical): No    Lack of Transportation (Non-Medical): No   Physical Activity: Not on file  Stress: Not on file  Social Connections: Not on file  Intimate Partner Violence: Not on file    ROS Review of Systems  Constitutional:  Negative for appetite change, chills, fatigue and fever.  HENT:  Negative for congestion, postnasal drip, rhinorrhea and sneezing.   Respiratory:  Negative for cough, shortness of breath and wheezing.   Cardiovascular:  Negative for chest pain, palpitations and leg swelling.  Gastrointestinal:  Positive for abdominal pain. Negative for constipation, nausea and vomiting.  Genitourinary:  Negative for difficulty urinating, dysuria, flank pain and frequency.  Musculoskeletal:  Negative for arthralgias, back pain, joint swelling and myalgias.  Skin:  Negative for color change, pallor, rash and wound.  Neurological:  Negative for dizziness, facial asymmetry, weakness, numbness and headaches.  Psychiatric/Behavioral:  Negative for behavioral problems, confusion, self-injury and suicidal ideas.     Objective:   Today's Vitals: BP 120/67   Pulse 78   Temp 97.6 F (36.4 C)   Ht 4' 11 (1.499 m)   Wt 179 lb (81.2 kg)   SpO2 100%   Breastfeeding No   BMI 36.15 kg/m   Physical Exam Vitals and nursing note reviewed.  Constitutional:      General: She is not in acute distress.    Appearance: Normal appearance. She is obese. She is not ill-appearing, toxic-appearing or diaphoretic.  HENT:     Mouth/Throat:     Mouth: Mucous membranes are moist.     Pharynx: Oropharynx is clear. No oropharyngeal exudate or posterior oropharyngeal erythema.  Eyes:     General: No scleral icterus.       Right eye: No discharge.        Left eye: No discharge.     Extraocular Movements: Extraocular movements intact.     Conjunctiva/sclera: Conjunctivae normal.  Cardiovascular:     Rate and Rhythm: Normal rate and regular rhythm.     Pulses: Normal pulses.     Heart sounds: Normal heart sounds. No murmur heard.    No friction rub. No  gallop.  Pulmonary:     Effort: Pulmonary effort is normal. No respiratory distress.     Breath sounds: Normal breath sounds. No stridor. No wheezing, rhonchi or rales.  Chest:     Chest wall: No tenderness.  Abdominal:     General: There is no distension.     Palpations: Abdomen is soft.     Tenderness: There is abdominal tenderness. There is no right CVA tenderness, left CVA tenderness or guarding.     Comments: left lower abdominal quadrant  Musculoskeletal:        General: No swelling, tenderness, deformity or signs of injury.     Right lower  leg: No edema.     Left lower leg: No edema.  Skin:    General: Skin is warm and dry.     Capillary Refill: Capillary refill takes less than 2 seconds.     Coloration: Skin is not jaundiced or pale.     Findings: No bruising, erythema or lesion.  Neurological:     Mental Status: She is alert and oriented to person, place, and time.     Motor: No weakness.     Coordination: Coordination normal.     Gait: Gait normal.  Psychiatric:        Mood and Affect: Mood normal.        Behavior: Behavior normal.        Thought Content: Thought content normal.        Judgment: Judgment normal.     Assessment & Plan:   Problem List Items Addressed This Visit       Digestive   Fatty liver   Patient encouraged to lose weight She is not interested in starting medication for obesity        Genitourinary   Acute vaginitis    - NuSwab Vaginitis Plus (VG+)        Relevant Orders   NuSwab Vaginitis Plus (VG+)     Other   Post traumatic stress disorder (PTSD) - Primary   Relevant Medications   traZODone  (DESYREL ) 50 MG tablet   GAD (generalized anxiety disorder)   Currently well-controlled Takes hydroxyzine  25 mg 3 times daily as needed Continue current medication Patient denies SI, HI Encouraged to reschedule her missed appointment with a therapist and psychiatrist      Relevant Medications   traZODone  (DESYREL ) 50 MG tablet    Obesity (BMI 30-39.9)   Wt Readings from Last 3 Encounters:  10/21/23 179 lb (81.2 kg)  05/11/22 172 lb (78 kg)  03/07/22 181 lb (82.1 kg)   Body mass index is 36.15 kg/m.  Patient counseled on low-carb diet Encouraged engage in regular moderate to vigorous exercises at least 150 minutes weekly Not interested in taking medication Patient referred to medical weight management clinic      Relevant Medications   atomoxetine  (STRATTERA ) 25 MG capsule   Other Relevant Orders   Amb Ref to Medical Weight Management   Left lower quadrant abdominal pain   Relevant Orders   Ambulatory referral to Gastroenterology   Mild depression      10/21/2023    8:45 AM 01/14/2023    9:51 AM 11/03/2022   11:49 AM  Depression screen PHQ 2/9  Decreased Interest 0 0 2  Down, Depressed, Hopeless 0 0 2  PHQ - 2 Score 0 0 4  Altered sleeping 1 1 3   Tired, decreased energy 1 1 2   Change in appetite 0 0 2  Feeling bad or failure about yourself  0 0 3  Trouble concentrating 0 2 2  Moving slowly or fidgety/restless 0 0 1  Suicidal thoughts 0 0 1  PHQ-9 Score 2 4 18   Difficult doing work/chores  Not difficult at all Somewhat difficult  Continue trazodone  50 mg at bedtime as needed for insomnia Patient denies SI, HI Encouraged to reschedule her missed appointment with a therapist and psychiatrist      Relevant Medications   traZODone  (DESYREL ) 50 MG tablet   Attention deficit hyperactivity disorder (ADHD), predominantly inattentive type   Strattera  25 mg daily refilled Encouraged to reschedule her missed appointment with a therapist and psychiatrist  Relevant Medications   atomoxetine  (STRATTERA ) 25 MG capsule   Prediabetes   Lab Results  Component Value Date   HGBA1C 5.5 08/15/2019  Checking A1c      Relevant Orders   Hemoglobin A1c   Screening for STD (sexually transmitted disease)   She has been maintaining 1 sexual partner Patient interested in STD screening Labs ordered       Relevant Orders   HepB+HepC+HIV Panel   RPR   NuSwab Vaginitis Plus (VG+)   Tobacco use   Need to quit smoking discussed      Back pain without sciatica   Currently denies back pain Continue use of heating pad as needed Stretching exercises also encouraged Can take OTC Tylenol  or ibuprofen  as needed      Other Visit Diagnoses       Screening for endocrine, nutritional, metabolic and immunity disorder       Relevant Orders   CBC   CMP14+EGFR       Outpatient Encounter Medications as of 10/21/2023  Medication Sig   hydrOXYzine  (ATARAX ) 25 MG tablet Take 1 tablet (25 mg total) by mouth 3 (three) times daily as needed.   ibuprofen  (ADVIL ) 600 MG tablet Take 1 tablet (600 mg total) by mouth every 6 (six) hours.   [DISCONTINUED] atomoxetine  (STRATTERA ) 25 MG capsule Take 1 capsule (25 mg total) by mouth daily.   [DISCONTINUED] traZODone  (DESYREL ) 50 MG tablet Take 1 tablet (50 mg total) by mouth at bedtime as needed for sleep.   atomoxetine  (STRATTERA ) 25 MG capsule Take 1 capsule (25 mg total) by mouth daily.   Prenatal Vit-Fe Fumarate-FA (MULTIVITAMIN-PRENATAL) 27-0.8 MG TABS tablet Take 1 tablet by mouth daily at 12 noon. (Patient not taking: Reported on 10/21/2023)   traZODone  (DESYREL ) 50 MG tablet Take 1 tablet (50 mg total) by mouth at bedtime as needed for sleep.   No facility-administered encounter medications on file as of 10/21/2023.    Follow-up: Return in about 6 months (around 04/19/2024) for CPE.   Kelden Lavallee R Nashley Cordoba, FNP

## 2023-10-21 NOTE — Assessment & Plan Note (Signed)
 Wt Readings from Last 3 Encounters:  10/21/23 179 lb (81.2 kg)  05/11/22 172 lb (78 kg)  03/07/22 181 lb (82.1 kg)   Body mass index is 36.15 kg/m.  Patient counseled on low-carb diet Encouraged engage in regular moderate to vigorous exercises at least 150 minutes weekly Not interested in taking medication Patient referred to medical weight management clinic

## 2023-10-21 NOTE — Assessment & Plan Note (Signed)
    10/21/2023    8:45 AM 01/14/2023    9:51 AM 11/03/2022   11:49 AM  Depression screen PHQ 2/9  Decreased Interest 0 0 2  Down, Depressed, Hopeless 0 0 2  PHQ - 2 Score 0 0 4  Altered sleeping 1 1 3   Tired, decreased energy 1 1 2   Change in appetite 0 0 2  Feeling bad or failure about yourself  0 0 3  Trouble concentrating 0 2 2  Moving slowly or fidgety/restless 0 0 1  Suicidal thoughts 0 0 1  PHQ-9 Score 2 4 18   Difficult doing work/chores  Not difficult at all Somewhat difficult  Continue trazodone  50 mg at bedtime as needed for insomnia Patient denies SI, HI Encouraged to reschedule her missed appointment with a therapist and psychiatrist

## 2023-10-21 NOTE — Assessment & Plan Note (Signed)
 Currently well-controlled Takes hydroxyzine  25 mg 3 times daily as needed Continue current medication Patient denies SI, HI Encouraged to reschedule her missed appointment with a therapist and psychiatrist

## 2023-10-21 NOTE — Assessment & Plan Note (Signed)
 She has been maintaining 1 sexual partner Patient interested in STD screening Labs ordered

## 2023-10-21 NOTE — Assessment & Plan Note (Signed)
-

## 2023-10-21 NOTE — Assessment & Plan Note (Signed)
 Need to quit smoking discussed

## 2023-10-21 NOTE — Assessment & Plan Note (Signed)
 Lab Results  Component Value Date   HGBA1C 5.5 08/15/2019  Checking A1c

## 2023-10-22 LAB — CBC
Hematocrit: 37.9 % (ref 34.0–46.6)
Hemoglobin: 12.1 g/dL (ref 11.1–15.9)
MCH: 30.5 pg (ref 26.6–33.0)
MCHC: 31.9 g/dL (ref 31.5–35.7)
MCV: 96 fL (ref 79–97)
Platelets: 244 10*3/uL (ref 150–450)
RBC: 3.97 x10E6/uL (ref 3.77–5.28)
RDW: 12.4 % (ref 11.7–15.4)
WBC: 8 10*3/uL (ref 3.4–10.8)

## 2023-10-22 LAB — CMP14+EGFR
ALT: 24 [IU]/L (ref 0–32)
AST: 21 [IU]/L (ref 0–40)
Albumin: 4.4 g/dL (ref 4.0–5.0)
Alkaline Phosphatase: 64 [IU]/L (ref 44–121)
BUN/Creatinine Ratio: 13 (ref 9–23)
BUN: 10 mg/dL (ref 6–20)
Bilirubin Total: 0.2 mg/dL (ref 0.0–1.2)
CO2: 24 mmol/L (ref 20–29)
Calcium: 9.4 mg/dL (ref 8.7–10.2)
Chloride: 102 mmol/L (ref 96–106)
Creatinine, Ser: 0.78 mg/dL (ref 0.57–1.00)
Globulin, Total: 2.2 g/dL (ref 1.5–4.5)
Glucose: 155 mg/dL — ABNORMAL HIGH (ref 70–99)
Potassium: 4.3 mmol/L (ref 3.5–5.2)
Sodium: 140 mmol/L (ref 134–144)
Total Protein: 6.6 g/dL (ref 6.0–8.5)
eGFR: 107 mL/min/{1.73_m2} (ref >59)

## 2023-10-22 LAB — RPR: RPR Ser Ql: NONREACTIVE

## 2023-10-22 LAB — HEPB+HEPC+HIV PANEL
HIV Screen 4th Generation wRfx: NONREACTIVE
Hep B C IgM: NEGATIVE
Hep B Core Total Ab: NEGATIVE
Hep B E Ab: NONREACTIVE
Hep B E Ag: NEGATIVE
Hep B Surface Ab, Qual: REACTIVE
Hep C Virus Ab: NONREACTIVE
Hepatitis B Surface Ag: NEGATIVE

## 2023-10-22 LAB — HEMOGLOBIN A1C
Est. average glucose Bld gHb Est-mCnc: 117 mg/dL
Hgb A1c MFr Bld: 5.7 % — ABNORMAL HIGH (ref 4.8–5.6)

## 2023-10-25 LAB — NUSWAB VAGINITIS PLUS (VG+)
Candida albicans, NAA: NEGATIVE
Candida glabrata, NAA: NEGATIVE
Chlamydia trachomatis, NAA: NEGATIVE
Neisseria gonorrhoeae, NAA: NEGATIVE
Trich vag by NAA: NEGATIVE

## 2023-11-23 ENCOUNTER — Encounter (INDEPENDENT_AMBULATORY_CARE_PROVIDER_SITE_OTHER): Payer: MEDICAID | Admitting: Adult Health

## 2023-11-28 ENCOUNTER — Encounter (INDEPENDENT_AMBULATORY_CARE_PROVIDER_SITE_OTHER): Payer: Self-pay

## 2023-12-03 ENCOUNTER — Emergency Department (HOSPITAL_BASED_OUTPATIENT_CLINIC_OR_DEPARTMENT_OTHER)
Admission: EM | Admit: 2023-12-03 | Discharge: 2023-12-03 | Disposition: A | Discharge status: Home / Self Care | Payer: MEDICAID | Attending: Emergency Medicine | Admitting: Emergency Medicine

## 2023-12-03 ENCOUNTER — Encounter (HOSPITAL_BASED_OUTPATIENT_CLINIC_OR_DEPARTMENT_OTHER): Payer: Self-pay

## 2023-12-03 DIAGNOSIS — X58XXXA Exposure to other specified factors, initial encounter: Secondary | ICD-10-CM | POA: Insufficient documentation

## 2023-12-03 DIAGNOSIS — S39012A Strain of muscle, fascia and tendon of lower back, initial encounter: Secondary | ICD-10-CM | POA: Diagnosis not present

## 2023-12-03 DIAGNOSIS — M545 Low back pain, unspecified: Secondary | ICD-10-CM | POA: Diagnosis present

## 2023-12-03 LAB — URINALYSIS, W/ REFLEX TO CULTURE (INFECTION SUSPECTED)
Bilirubin Urine: NEGATIVE
Glucose, UA: NEGATIVE mg/dL
Hgb urine dipstick: NEGATIVE
Ketones, ur: NEGATIVE mg/dL
Leukocytes,Ua: NEGATIVE
Nitrite: NEGATIVE
Protein, ur: NEGATIVE mg/dL
Specific Gravity, Urine: 1.025 (ref 1.005–1.030)
pH: 6 (ref 5.0–8.0)

## 2023-12-03 LAB — PREGNANCY, URINE: Preg Test, Ur: NEGATIVE

## 2023-12-03 MED ORDER — KETOROLAC TROMETHAMINE 10 MG PO TABS: 10.0000 mg | ORAL_TABLET | Freq: Four times a day (QID) | ORAL | 0 refills | Status: DC | PRN

## 2023-12-03 MED ORDER — CYCLOBENZAPRINE HCL 10 MG PO TABS: 10.0000 mg | ORAL_TABLET | Freq: Two times a day (BID) | ORAL | 0 refills | Status: DC | PRN

## 2023-12-03 MED ORDER — KETOROLAC TROMETHAMINE 60 MG/2ML IM SOLN
30.0000 mg | Freq: Once | INTRAMUSCULAR | Status: AC
  Administered 2023-12-03: 30 mg via INTRAMUSCULAR
  Filled 2023-12-03: qty 2

## 2023-12-03 MED ORDER — LIDOCAINE 5 % EX PTCH: 1.0000 | MEDICATED_PATCH | CUTANEOUS | 0 refills | Status: DC

## 2023-12-03 MED ORDER — HYDROCODONE-ACETAMINOPHEN 5-325 MG PO TABS
1.0000 | ORAL_TABLET | Freq: Once | ORAL | Status: DC
  Filled 2023-12-03: qty 1

## 2023-12-03 MED ORDER — CYCLOBENZAPRINE HCL 5 MG PO TABS
5.0000 mg | ORAL_TABLET | Freq: Once | ORAL | Status: AC
Start: 2023-12-03 — End: 2023-12-03
  Administered 2023-12-03: 5 mg via ORAL
  Filled 2023-12-03: qty 1

## 2023-12-03 MED ORDER — LIDOCAINE 5 % EX PTCH
1.0000 | MEDICATED_PATCH | CUTANEOUS | Status: DC
  Administered 2023-12-03: 1 via TRANSDERMAL
  Filled 2023-12-03 (×2): qty 1

## 2023-12-03 MED ORDER — ACETAMINOPHEN 325 MG PO TABS
650.0000 mg | ORAL_TABLET | Freq: Once | ORAL | Status: AC
  Administered 2023-12-03: 650 mg via ORAL
  Filled 2023-12-03: qty 2

## 2023-12-03 NOTE — ED Notes (Signed)
 Dc instructions reviewed with patient. Patient voiced understanding. Dc with belongings.

## 2023-12-03 NOTE — Discharge Instructions (Addendum)
 Your urinalysis did not show evidence of urinary tract infection and there was no blood in your urine to suggest a kidney stone.  Your symptoms are consistent with lumbar muscle strain.  We will treat with multimodal pain control.  Please return to the emergency department for any worsening symptoms to include new falls or trauma, back pain with associated fever, new weakness or numbness in the lower extremities, numbness in the area of your groin where you would sit on a saddle, urinary or fecal incontinence and back pain.  A muscle relaxant has also been prescribed called Flexeril, do not operate heavy machinery while taking Flexeril.

## 2023-12-03 NOTE — ED Provider Notes (Signed)
 Candice Farrell EMERGENCY DEPARTMENT AT [facility] Provider Note   CSN: [id number] Arrival date & time: 12/03/23  4098     History  Chief Complaint  Patient presents with   Back Pain    Candice Farrell is a 28 y.o. female.   Back Pain    28 year old female with medical history significant for anxiety, depression, scoliosis, back pain, PCOS who presents to the emergency department with low back pain.  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  She denies any falls or trauma.  She denies any specific injury or heavy lifting that she can think of but on Thursday she developed right-sided low back pain that came on at work.  She denies any fevers, chills, saddle anesthesia, urinary or fecal incontinence.  No weakness in the lower extremities.  No radiculopathy.  Pain is in the right back, worse with muscle engagement, twisting and movement.  No nausea or vomiting.  Her last menstrual period was a month ago and she is about 2 weeks irregular however she has PCOS and is sometimes irregular.  She denies any dysuria, endorses mild urinary frequency.  Home Medications Prior to Admission medications   Medication Sig Start Date End Date Taking? Authorizing Provider  cyclobenzaprine (FLEXERIL) 10 MG tablet Take 1 tablet (10 mg total) by mouth 2 (two) times daily as needed for muscle spasms. 12/03/23  Yes Ernie Avena, MD  ketorolac (TORADOL) 10 MG tablet Take 1 tablet (10 mg total) by mouth every 6 (six) hours as needed. 12/03/23  Yes Ernie Avena, MD  lidocaine (LIDODERM) 5 % Place 1 patch onto the skin daily. Remove & Discard patch within 12 hours or as directed by MD 12/03/23  Yes Ernie Avena, MD  atomoxetine (STRATTERA) 25 MG capsule Take 1 capsule (25 mg total) by mouth daily. 10/21/23   Donell Beers, FNP  hydrOXYzine (ATARAX) 25 MG tablet Take 1 tablet (25 mg total) by mouth 3 (three) times daily as needed. 01/14/23   Shanna Cisco, NP  Prenatal Vit-Fe Fumarate-FA (MULTIVITAMIN-PRENATAL) 27-0.8 MG TABS tablet Take 1 tablet by mouth daily at 12 noon. Patient not taking: Reported on 10/21/2023    [provider]  traZODone (DESYREL) 50 MG tablet Take 1 tablet (50 mg total) by mouth at bedtime as needed for sleep. 10/21/23   Donell Beers, FNP      Allergies    Morphine and codeine    Review of Systems   Review of Systems  Musculoskeletal:  Positive for back pain.  All other systems reviewed and are negative.   Physical Exam Updated Vital Signs BP 115/72 (BP Location: Right Arm)   Pulse 77   Temp 98.2 F (36.8 C) (Oral)   Resp 16   LMP  (LMP Unknown) Comment: P.C.O.S.  SpO2 100%  Physical Exam Vitals and nursing note reviewed.  Constitutional:      General: She is not in acute distress.    Appearance: She is well-developed.  HENT:     Head: Normocephalic and atraumatic.  Eyes:     Conjunctiva/sclera: Conjunctivae normal.  Cardiovascular:     Rate and Rhythm: Normal rate and regular rhythm.  Pulmonary:     Effort: Pulmonary effort is normal. No respiratory distress.     Breath sounds: Normal breath sounds.  Abdominal:     Palpations: Abdomen is soft.     Tenderness: There is no abdominal tenderness.  Musculoskeletal:  General: No swelling.     Cervical back: Neck supple.     Comments: No midline tenderness to palpation of the cervical, thoracic or lumbar spine.  Paraspinal muscular tenderness to palpation of the right lumbar paraspinal muscles.  Negative straight leg raise test bilaterally  Skin:    General: Skin is warm and dry.     Capillary Refill: Capillary refill takes less than 2 seconds.  Neurological:     Mental Status: She is alert.     GCS: GCS eye subscore is 4. GCS verbal subscore is 5. GCS motor subscore is 6.     Comments: 5 out of 5 strength in the bilateral upper and lower extremities with intact sensation to light touch  Psychiatric:        Mood and Affect:  Mood normal.     ED Results / Procedures / Treatments   Labs (all labs ordered are listed, but only abnormal results are displayed) Labs Reviewed  URINALYSIS, W/ REFLEX TO CULTURE (INFECTION SUSPECTED) - Abnormal; Notable for the following components:      Result Value   Bacteria, UA RARE (*)    All other components within normal limits  PREGNANCY, URINE    EKG None  Radiology No results found.  Procedures Procedures    Medications Ordered in ED Medications  lidocaine (LIDODERM) 5 % 1 patch (1 patch Transdermal Patch Applied 12/03/23 0749)  cyclobenzaprine (FLEXERIL) tablet 5 mg (5 mg Oral Given 12/03/23 0749)  acetaminophen (TYLENOL) tablet 650 mg (650 mg Oral Given 12/03/23 0749)  ketorolac (TORADOL) injection 30 mg (30 mg Intramuscular Given 12/03/23 8469)    ED Course/ Medical Decision Making/ A&P                                 Medical Decision Making Amount and/or Complexity of Data Reviewed Labs: ordered.  Risk OTC drugs. Prescription drug management.    29 year old female with medical history significant for anxiety, depression, scoliosis, back pain, PCOS who presents to the emergency department with low back pain.  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  She denies any falls or trauma.  She denies any specific injury or heavy lifting that she can think of but on Thursday she developed right-sided low back pain that came on at work.  She denies any fevers, chills, saddle anesthesia, urinary or fecal incontinence.  No weakness in the lower extremities.  No radiculopathy.  Pain is in the right back, worse with muscle engagement, twisting and movement.  No nausea or vomiting.  Her last menstrual period was a month ago and she is about 2 weeks irregular however she has PCOS and is sometimes irregular.  She denies any dysuria, endorses mild urinary frequency.  Medical Decision Making:   Candice Farrell is a 28 y.o. female  who presented to the ED today with acute lower back pain over the past 72 hours, detailed above.      On my initial exam, the pt was with an intact neurologic exam, tolerating ambulation with an antalgic gait and p.o. intake without difficulty.  Patient had no midline spinal tenderness.  Patient endorsing complete sensation of the perineum.  Patient without episodes of fecal or urinary incontinence.  Patient has no focal neurologic deficits and reassuring vital signs at this time.  No obvious physical abnormality or injury on exam. Notably, patient denies recent  trauma, is afebrile, and denies IVDU.  Last menstrual period was one moth ago.  Reviewed and confirmed nursing documentation for past medical history, family history, social history.    Initial Assessment:   With the patient's presentation of acute back pain in the above setting, most likely diagnosis is musculoskeletal strain. Other diagnoses were considered including (but not limited to) underlying fracture, epidural hematoma, cauda equina syndrome, spinal stenosis, spinal malignancy. These are considered less likely due to history of present illness and physical exam findings.   In particular, lack of fever, substantial history of IV drug use, or substantial neurologic abnormality is less consistent with epidural abscess versus discitis or other spinal infection.   Initial Plan:  Multimodal pain control described and patient informed on safe usage.  Do not think imaging evaluation is indicated at this time Patient stable for continued outpatient evaluation and management of their musculoskeletal pains.  Patient referred back to primary care provider for continued evaluation and management.   Initial Study Results:   Radiology  No orders to display     Disposition:   Based on the above findings, I believe patient is stable for discharge.    Patient and family educated about specific return precautions for given chief complaint  and symptoms.  Patient and family educated about follow-up with her PCP.  Patient and family expressed understanding of return precautions and need for follow-up. Patient spoken to regarding all imaging and laboratory results and appropriate follow up for these results. All education provided in verbal and written form and time was allowed for answering of patient questions. Patient discharged.          Emergency Department Medication Summary:   Medications  lidocaine (LIDODERM) 5 % 1 patch (1 patch Transdermal Patch Applied 12/03/23 0749)  cyclobenzaprine (FLEXERIL) tablet 5 mg (5 mg Oral Given 12/03/23 0749)  acetaminophen (TYLENOL) tablet 650 mg (650 mg Oral Given 12/03/23 0749)  ketorolac (TORADOL) injection 30 mg (30 mg Intramuscular Given 12/03/23 0817)      Final Clinical Impression(s) / ED Diagnoses Final diagnoses:  Strain of lumbar region, initial encounter    Rx / DC Orders ED Discharge Orders          Ordered    lidocaine (LIDODERM) 5 %  Every 24 hours        12/03/23 0802    cyclobenzaprine (FLEXERIL) 10 MG tablet  2 times daily PRN        12/03/23 0802    ketorolac (TORADOL) 10 MG tablet  Every 6 hours PRN        12/03/23 0805              Ernie Avena, MD 12/03/23 [id number]

## 2023-12-03 NOTE — ED Triage Notes (Signed)
 She c/o recent right-sided low back pain. She denies paresthesias of any extremity and of all toes/feet bilat. She denies trauma.

## 2023-12-05 ENCOUNTER — Telehealth: Payer: Self-pay

## 2023-12-05 NOTE — Transitions of Care (Post Inpatient/ED Visit) (Signed)
   12/05/2023  Name: Candice Farrell MRN: [id number] DOB: 1996-01-18  Today's TOC FU Call Status: Today's TOC FU Call Status:: Unsuccessful Call (1st Attempt) Unsuccessful Call (1st Attempt) Date: 12/05/23  Attempted to reach the patient regarding the most recent Inpatient/ED visit.  Follow Up Plan: Additional outreach attempts will be made to reach the patient to complete the Transitions of Care (Post Inpatient/ED visit) call.   Signature  [employer], [place]

## 2023-12-06 ENCOUNTER — Telehealth: Payer: Self-pay

## 2023-12-06 NOTE — Transitions of Care (Post Inpatient/ED Visit) (Signed)
   12/06/2023  Name: Candice Farrell MRN: [id number] DOB: [birth year]  Today's TOC FU Call Status:   Patient's Name and Date of Birth confirmed.  Transition Care Management Follow-up Telephone Call Date of Discharge: 12/03/23 Discharge Facility: [facility] ([facility]) How have you been since you were released from the hospital?: Better Any questions or concerns?: No  Items Reviewed: Did you receive and understand the discharge instructions provided?: No Medications obtained,verified, and reconciled?: Yes (Medications Reviewed) Any new allergies since your discharge?: No Dietary orders reviewed?: No Do you have support at home?: Yes People in Home: significant other  Medications Reviewed Today: Medications Reviewed Today     Reviewed by Veneta Penton, CMA (Certified Medical Assistant) on 12/06/23 at 1603  Med List Status: <None>   Medication Order Taking? Sig Documenting Provider Last Dose Status Informant  atomoxetine (STRATTERA) 25 MG capsule [id number] No Take 1 capsule (25 mg total) by mouth daily.  Patient not taking: Reported on 12/06/2023   Donell Beers, FNP Not Taking Active   cyclobenzaprine (FLEXERIL) 10 MG tablet [id number] Yes Take 1 tablet (10 mg total) by mouth 2 (two) times daily as needed for muscle spasms. Ernie Avena, MD Taking Active   hydrOXYzine (ATARAX) 25 MG tablet [id number] No Take 1 tablet (25 mg total) by mouth 3 (three) times daily as needed.  Patient not taking: Reported on 12/06/2023   Shanna Cisco, NP Not Taking Active   ketorolac (TORADOL) 10 MG tablet [id number] Yes Take 1 tablet (10 mg total) by mouth every 6 (six) hours as needed. Ernie Avena, MD Taking Active   lidocaine (LIDODERM) 5 % [id number] No Place 1 patch onto the skin daily. Remove & Discard patch within 12 hours or as directed by MD  Patient not taking: Reported on 12/06/2023   Ernie Avena, MD Not Taking Active   Prenatal Vit-Fe Fumarate-FA (MULTIVITAMIN-PRENATAL)  27-0.8 MG TABS tablet [id number] No Take 1 tablet by mouth daily at 12 noon.  Patient not taking: Reported on 12/06/2023   [provider] Not Taking Active   traZODone (DESYREL) 50 MG tablet [id number] No Take 1 tablet (50 mg total) by mouth at bedtime as needed for sleep.  Patient not taking: Reported on 12/06/2023   Donell Beers, FNP Not Taking Active   Med List Note [facility] 03/24/14 2356): No preferred pharmacy             Home Care and Equipment/Supplies: Were Home Health Services Ordered?: No Any new equipment or medical supplies ordered?: No  Functional Questionnaire: Do you need assistance with bathing/showering or dressing?: No Do you need assistance with meal preparation?: No Do you need assistance with eating?: No Do you have difficulty maintaining continence: No Do you need assistance with getting out of bed/getting out of a chair/moving?: No Do you have difficulty managing or taking your medications?: No  Follow up appointments reviewed: PCP Follow-up appointment confirmed?: Yes Date of PCP follow-up appointment?: 12/07/23 Specialist Hospital Follow-up appointment confirmed?: NA Do you need transportation to your follow-up appointment?: No Do you understand care options if your condition(s) worsen?: Yes-patient verbalized understanding    SIGNATURE Lazlo Tunney, RMA

## 2023-12-07 ENCOUNTER — Encounter: Payer: Self-pay | Admitting: Nurse Practitioner

## 2023-12-07 ENCOUNTER — Ambulatory Visit (INDEPENDENT_AMBULATORY_CARE_PROVIDER_SITE_OTHER): Payer: MEDICAID | Admitting: Nurse Practitioner

## 2023-12-07 VITALS — BP 104/62 | HR 90 | Temp 98.1°F | Wt 180.8 lb

## 2023-12-07 DIAGNOSIS — S39012A Strain of muscle, fascia and tendon of lower back, initial encounter: Secondary | ICD-10-CM | POA: Insufficient documentation

## 2023-12-07 DIAGNOSIS — Z09 Encounter for follow-up examination after completed treatment for conditions other than malignant neoplasm: Secondary | ICD-10-CM | POA: Insufficient documentation

## 2023-12-07 DIAGNOSIS — S39012D Strain of muscle, fascia and tendon of lower back, subsequent encounter: Secondary | ICD-10-CM

## 2023-12-07 HISTORY — DX: Strain of muscle, fascia and tendon of lower back, initial encounter: S39.012A

## 2023-12-07 NOTE — Assessment & Plan Note (Addendum)
 Continue Toradol 10 mg every 6 hours as needed,  Can take Flexeril 10 mg at bedtime as needed since she complained of drowsiness with the medication alternate Toradol with Tylenol 650 mg every 6 hours as needed Stretching exercises, application of heating pad encouraged

## 2023-12-07 NOTE — Patient Instructions (Signed)

## 2023-12-07 NOTE — Progress Notes (Signed)
 Acute Office Visit  Subjective:     Patient ID: Candice Farrell, female    DOB: March 15, 1996, 28 y.o.   MRN: [id number]  Chief Complaint  Patient presents with   Hospitalization Follow-up    Patient stated that she is feeling better     HPI Candice Farrell has a past medical history of Anxiety, Back pain, Depression, Gestational diabetes, PCOS (polycystic ovarian syndrome), PONV (postoperative nausea and vomiting), and Scoliosis.   Patient is in today for hospitalization follow-up.  Patient was at the ED on 12/03/2023 for strain of lumbar region, she was prescribed Toradol 10 mg every 6 hours as needed, lidocaine 5% transdermal patch, Flexeril 10 mg twice daily as needed.  She currently denies pain, still has some muscle tightness.  She is ready to go back to work tomorrow.    Review of Systems  Constitutional:  Negative for appetite change, chills, fatigue and fever.  HENT:  Negative for congestion, postnasal drip, rhinorrhea and sneezing.   Respiratory:  Negative for cough, shortness of breath and wheezing.   Cardiovascular:  Negative for chest pain, palpitations and leg swelling.  Gastrointestinal:  Negative for abdominal pain, constipation, nausea and vomiting.  Genitourinary:  Negative for difficulty urinating, dysuria, flank pain and frequency.  Musculoskeletal:  Negative for arthralgias, back pain, joint swelling and myalgias.  Skin:  Negative for color change, pallor, rash and wound.  Neurological:  Negative for dizziness, facial asymmetry, weakness, numbness and headaches.  Psychiatric/Behavioral:  Negative for behavioral problems, confusion, self-injury and suicidal ideas.         Objective:    BP 104/62   Pulse 90   Temp 98.1 F (36.7 C) (Oral)   Wt 180 lb 12.8 oz (82 kg)   LMP  (LMP Unknown) Comment: P.C.O.S.  SpO2 100%   BMI 36.52 kg/m    Physical Exam Vitals and nursing note reviewed.  Constitutional:      General: She is not in acute distress.     Appearance: Normal appearance. She is obese. She is not ill-appearing, toxic-appearing or diaphoretic.  HENT:     Mouth/Throat:     Mouth: Mucous membranes are moist.     Pharynx: Oropharynx is clear. No oropharyngeal exudate or posterior oropharyngeal erythema.  Eyes:     General: No scleral icterus.       Right eye: No discharge.        Left eye: No discharge.     Extraocular Movements: Extraocular movements intact.     Conjunctiva/sclera: Conjunctivae normal.  Cardiovascular:     Rate and Rhythm: Normal rate and regular rhythm.     Pulses: Normal pulses.     Heart sounds: Normal heart sounds. No murmur heard.    No friction rub. No gallop.  Pulmonary:     Effort: Pulmonary effort is normal. No respiratory distress.     Breath sounds: Normal breath sounds. No stridor. No wheezing, rhonchi or rales.  Chest:     Chest wall: No tenderness.  Abdominal:     General: There is no distension.     Palpations: Abdomen is soft.     Tenderness: There is no abdominal tenderness. There is no right CVA tenderness, left CVA tenderness or guarding.  Musculoskeletal:        General: No swelling, tenderness, deformity or signs of injury.     Right lower leg: No edema.     Left lower leg: No edema.  Skin:    General: Skin is warm  and dry.     Capillary Refill: Capillary refill takes less than 2 seconds.     Coloration: Skin is not jaundiced or pale.     Findings: No bruising, erythema or lesion.  Neurological:     Mental Status: She is alert and oriented to person, place, and time.     Motor: No weakness.     Coordination: Coordination normal.     Gait: Gait normal.  Psychiatric:        Mood and Affect: Mood normal.        Behavior: Behavior normal.        Thought Content: Thought content normal.        Judgment: Judgment normal.     No results found for any visits on 12/07/23.      Assessment & Plan:   Problem List Items Addressed This Visit       Musculoskeletal and Integument    Strain of lumbar region - Primary   Continue Toradol 10 mg every 6 hours as needed,  Can take Flexeril 10 mg at bedtime as needed since she complained of drowsiness with the medication alternate Toradol with Tylenol 650 mg every 6 hours as needed Stretching exercises, application of heating pad encouraged        Other   Encounter for examination following treatment at hospital   Hospital chart reviewed, including discharge summary Medications reconciled and reviewed with the patient in detail        No orders of the defined types were placed in this encounter.   Return in about 5 months (around 05/08/2024) for CPE.  Donell Beers, FNP

## 2023-12-07 NOTE — Assessment & Plan Note (Signed)
 Hospital chart reviewed, including discharge summary Medications reconciled and reviewed with the patient in detail

## 2023-12-27 ENCOUNTER — Encounter: Payer: Self-pay | Admitting: Nurse Practitioner

## 2023-12-27 ENCOUNTER — Ambulatory Visit: Payer: MEDICAID | Admitting: Nurse Practitioner

## 2023-12-27 VITALS — BP 128/82 | HR 72 | Temp 98.1°F

## 2023-12-27 DIAGNOSIS — R195 Other fecal abnormalities: Secondary | ICD-10-CM

## 2023-12-27 DIAGNOSIS — J069 Acute upper respiratory infection, unspecified: Secondary | ICD-10-CM

## 2023-12-27 DIAGNOSIS — M5441 Lumbago with sciatica, right side: Secondary | ICD-10-CM

## 2023-12-27 LAB — POC COVID19/FLU A&B COMBO
Covid Antigen, POC: NEGATIVE
Influenza A Antigen, POC: NEGATIVE
Influenza B Antigen, POC: NEGATIVE

## 2023-12-27 MED ORDER — PREDNISONE 10 MG PO TABS: ORAL_TABLET | ORAL | 0 refills | Status: DC

## 2023-12-27 MED ORDER — PREDNISONE 10 MG PO TABS: ORAL_TABLET | ORAL | 0 refills | Status: AC

## 2023-12-27 NOTE — Progress Notes (Signed)
 [facility]- Acute Office Visit  Subjective:     Patient ID: Candice Farrell, female    DOB: [birth year], 28 y.o.   MRN: [id number]  Chief Complaint  Patient presents with   Leg Pain    HPI Patient is in today for several concerns today.  URI: Recently, her son was diagnosed with RSV and everyone in her household is sick. 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 Currently congested. No fevers or chills. Her symptoms started 4 days ago.     Right Leg pain: started a week ago. No injury or trauma to leg. Pain feels like an ache. Right and mid-ower back hurts as well, pain is radiating to lower leg. She also feels some tingling in her right foot at times.  Has pain when bending down to reach something. Was seen for muscle strain 12/07/23- given flexeril which she does not take due to this causing drowsiness. Also given Toradol states she stopped taking this 1-1.5 weeks ago.    Dark Stools x  1-2 weeks. Reports painful bowel movements due to back pain. Stools are not hard and she denies constipation.  She is having bowel movements 2-3 times a day. No blood present but reports stool is dark. Hx of abdominal pain for several years and has had work-up via PCP. Has never seen GI for this.   Review of Systems  Constitutional:  Negative for chills and fever.  HENT:  Positive for congestion. Negative for sore throat.   Respiratory:  Negative for shortness of breath.   Cardiovascular:  Negative for chest pain.  Gastrointestinal:  Positive for abdominal pain (chronic) and nausea. Negative for blood in stool, constipation, diarrhea and vomiting.  Musculoskeletal:  Positive for back pain.  Neurological:  Negative for dizziness and focal weakness.        Objective:    BP 128/82 (BP Location: Left Arm, Patient Position: Sitting, Cuff Size: Normal)   Pulse 72   Temp 98.1 F (36.7 C)   LMP  (LMP Unknown) Comment: P.C.O.S.  SpO2 99%    Physical  Exam Constitutional:      General: She is not in acute distress. Cardiovascular:     Rate and Rhythm: Normal rate and regular rhythm.     Heart sounds: Normal heart sounds.  Pulmonary:     Effort: Pulmonary effort is normal.     Breath sounds: Normal breath sounds.  Abdominal:     General: Bowel sounds are normal. There is no distension.     Palpations: Abdomen is soft. There is no mass.     Tenderness: There is no abdominal tenderness. There is no guarding.  Musculoskeletal:     Lumbar back: No swelling, deformity or tenderness.     Right lower leg: No edema.     Left lower leg: No edema.  Neurological:     General: No focal deficit present.     Mental Status: She is alert and oriented to person, place, and time.     Results for orders placed or performed in visit on 12/27/23  POC Covid19/Flu A&B Antigen  Result Value Ref Range   Influenza A Antigen, POC Negative Negative   Influenza B Antigen, POC Negative Negative   Covid Antigen, POC Negative Negative        Assessment & Plan:   Problem List Items Addressed This Visit   None Visit Diagnoses       Viral upper respiratory tract infection    -  Primary   Relevant Orders   POC Covid19/Flu A&B Antigen (Completed) Recommended supportive care measures and symptom management.      Acute right-sided low back pain with right-sided sciatica       Relevant Medications   predniSONE (DELTASONE) 10 MG tablet     Dark stools      Discontinue Toradol. Due to worsening back pain, will trial course of steroids for sciatic/back pain  Also recommended supportive measures and to monitor sugar/carb intake due to hx of pre-diabetes.  Discussed red flag symptoms and when to seek care. Recommended urgent ED follow-up if bleeding in stool or if abdominal pain worsens. Also recommended following up with PCP next week for follow-up. Would benefit from GI referral due to chronic concerns.      Meds ordered this encounter   Medications                        predniSONE (DELTASONE) 10 MG tablet    Sig: Take 4 tablets (40 mg total) by mouth daily with breakfast for 2 days, THEN 3 tablets (30 mg total) daily with breakfast for 2 days, THEN 2 tablets (20 mg total) daily with breakfast for 2 days, THEN 1 tablet (10 mg total) daily with breakfast for 2 days.    Dispense:  20 tablet    Refill:  0    Supervising Provider:   BACIGALUPO, ANGELA M [[id number]]   As needed.  Kenard Morawski Flores Conn Trombetta, NP

## 2023-12-28 ENCOUNTER — Ambulatory Visit (INDEPENDENT_AMBULATORY_CARE_PROVIDER_SITE_OTHER): Payer: MEDICAID | Admitting: Nurse Practitioner

## 2023-12-28 ENCOUNTER — Encounter: Payer: Self-pay | Admitting: Nurse Practitioner

## 2023-12-28 ENCOUNTER — Ambulatory Visit: Payer: Self-pay

## 2023-12-28 VITALS — BP 117/67 | HR 83 | Temp 97.4°F | Wt 184.0 lb

## 2023-12-28 DIAGNOSIS — R309 Painful micturition, unspecified: Secondary | ICD-10-CM | POA: Insufficient documentation

## 2023-12-28 DIAGNOSIS — R195 Other fecal abnormalities: Secondary | ICD-10-CM | POA: Diagnosis not present

## 2023-12-28 DIAGNOSIS — N898 Other specified noninflammatory disorders of vagina: Secondary | ICD-10-CM | POA: Insufficient documentation

## 2023-12-28 DIAGNOSIS — K649 Unspecified hemorrhoids: Secondary | ICD-10-CM | POA: Diagnosis not present

## 2023-12-28 DIAGNOSIS — S39012D Strain of muscle, fascia and tendon of lower back, subsequent encounter: Secondary | ICD-10-CM | POA: Diagnosis not present

## 2023-12-28 DIAGNOSIS — Z113 Encounter for screening for infections with a predominantly sexual mode of transmission: Secondary | ICD-10-CM

## 2023-12-28 HISTORY — DX: Unspecified hemorrhoids: K64.9

## 2023-12-28 MED ORDER — HYDROCORTISONE ACETATE 25 MG RE SUPP: 25.0000 mg | Freq: Two times a day (BID) | RECTAL | 0 refills | Status: AC

## 2023-12-28 NOTE — Assessment & Plan Note (Signed)
-  Tylenol 650 mg every 6 hours as needed - Flexeril 10 mg twice daily as needed - use of heating pad and stretching exercises.   -Take prednisone with food to help protect your stomach  - Lidocaine 5%  every 24 hours  as needed

## 2023-12-28 NOTE — Telephone Encounter (Addendum)
  Chief Complaint: vaginal rash Symptoms: dryness, peeling Frequency: yesterday Pertinent Negatives: Patient denies injury, fever, pain, itching Disposition: [] ED /[] Urgent Care (no appt availability in office) / [x] Appointment(In office/virtual)/ []  [facility]/ [] Home Care/ [] Refused Recommended Disposition /[] [facility]/ []  Follow-up with PCP Additional Notes: Pt c/o vaginal rash s/p shaving. Pt reports that skin on outer and inner labia is dry and peeling. Denies any injuries, itching, pain. Pt does endorse some back pain with urination and BM, but unknown if r.t straining. Pt also endorses taking sexual stimulant medication orally that may have relation to current sx. Scheduled patient per protocol on 12/28/2023. Patient verbalized understanding and to call back with worsening symptoms.     Copied from CRM [id number]. Topic: Clinical - Red Word Triage >> Dec 28, 2023  8:58 AM Baldemar Lev wrote: Red Word that prompted transfer to Nurse Triage: Says she is having a GYN related emergency Reason for Disposition  [1] Rash (e.g., redness, tiny bumps, sore) of genital area AND [2] present > 24 hours  Answer Assessment - Initial Assessment Questions 1. SYMPTOM: "What's the main symptom you're concerned about?" (e.g., pain, itching, dryness)     Vagina peeling inside of labia, buttocks - dryness 2. LOCATION: "Where is the  dryness located?" (e.g., inside/outside, left/right)     Inside and outside of labia/buttocks - where she shaves 3. ONSET: "When did the  dryness  start?"     yesterday 4. PAIN: "Is there any pain?" If Yes, ask: "How bad is it?" (Scale: 1-10; mild, moderate, severe)   -  MILD (1-3): Doesn't interfere with normal activities.    -  MODERATE (4-7): Interferes with normal activities (e.g., work or school) or awakens from sleep.     -  SEVERE (8-10): Excruciating pain, unable to do any normal activities.     denies 5. ITCHING: "Is there any itching?" If Yes,  ask: "How bad is it?" (Scale: 1-10; mild, moderate, severe)     denies 6. CAUSE: "What do you think is causing the discharge?" "Have you had the same problem before? What happened then?"     Denies discharge 7. OTHER SYMPTOMS: "Do you have any other symptoms?" (e.g., fever, itching, vaginal bleeding, pain with urination, injury to genital area, vaginal foreign body)     "Issues using the bathroom lately" back pain with urinate/BM Recently used oral medication to stimulate vaginal area 8. PREGNANCY: "Is there any chance you are pregnant?" "When was your last menstrual period?"     LMP a few days ago  Protocols used: Vaginal Symptoms-A-AH

## 2023-12-28 NOTE — Assessment & Plan Note (Addendum)
 Patient requesting for STD screening, still maintaining 1 sexual partner for over 10 years - NuSwab Vaginitis Plus (VG+) - HSV 1 and 2 Ab, IgG - HepB+HepC+HIV Panel

## 2023-12-28 NOTE — Assessment & Plan Note (Addendum)
 Avoid using harsh soap and sponges - NuSwab Vaginitis Plus (VG+)

## 2023-12-28 NOTE — Progress Notes (Signed)
 Acute Office Visit  Subjective:     Patient ID: Candice Farrell, female    DOB: September 07, 1996, 28 y.o.   MRN: [id number]  Chief Complaint  Patient presents with   Rash    HPI Candice Farrell  has a past medical history of Anxiety, Back pain, Back pain without sciatica (10/21/2023), Depression, Gestational diabetes, Hemorrhoids (12/28/2023), PCOS (polycystic ovarian syndrome), PONV (postoperative nausea and vomiting), Prediabetes (10/21/2023), Scoliosis, and Strain of lumbar region (12/07/2023). Patient is in today for complaints of vaginal irritation, back pain, dark stools   Vaginal irritation. Stated that she had noticed that the skin  of her inner labia is dry and peeling, she first noticed this  yesterday.  Stated that she has been using an exfoliating sponge to wash her genital area.  She denies dysuria, vaginal discharge  Dark stool.  Patient complains of dark stool that started about 2 and half weeks ago, she has hemorrhoids and notices blood when wiping after having a bowel movement.  She has started using a multiple vitamin a month ago but does not take this medication daily.  Was prescribed Toradol for back pain but she has not been taking the medication for over a week. she denies abdominal pain constipation, no nausea, vomiting.  Strain of lumbar region.  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  She had been taking Flexeril as needed that had helped resolve the pain, however  she became worried when she started having back pain whenever she urinates or have a bowel movement.  She was seen yesterday by a NP at work who prescribed prednisone for her worsening back pain, she has not picked up the prescription yet. she had not taken  Toradol and Flexeril for over 1 week.    Review of Systems  Constitutional:  Negative for appetite change, chills, fatigue and fever.  HENT:  Negative for congestion,  postnasal drip, rhinorrhea and sneezing.   Respiratory:  Negative for cough, shortness of breath and wheezing.   Cardiovascular:  Negative for chest pain, palpitations and leg swelling.  Gastrointestinal:  Negative for abdominal pain, constipation, nausea and vomiting.  Genitourinary:  Negative for difficulty urinating, dysuria, flank pain and frequency.  Musculoskeletal:  Positive for back pain. Negative for arthralgias, joint swelling and myalgias.  Skin:  Negative for color change, pallor, rash and wound.  Neurological:  Negative for dizziness, facial asymmetry, weakness, numbness and headaches.  Psychiatric/Behavioral:  Negative for behavioral problems, confusion, self-injury and suicidal ideas.         Objective:    BP 117/67   Pulse 83   Temp (!) 97.4 F (36.3 C)   Wt 184 lb (83.5 kg)   LMP  (LMP Unknown) Comment: P.C.O.S.  SpO2 99%   BMI 37.16 kg/m    Physical Exam Vitals and nursing note reviewed. Exam conducted with a chaperone present.  Constitutional:      General: She is not in acute distress.    Appearance: Normal appearance. She is obese. She is not ill-appearing, toxic-appearing or diaphoretic.  HENT:     Mouth/Throat:     Mouth: Mucous membranes are moist.     Pharynx: Oropharynx is clear. No oropharyngeal exudate or posterior oropharyngeal erythema.  Eyes:     General: No scleral icterus.       Right eye: No discharge.        Left eye: No  discharge.     Extraocular Movements: Extraocular movements intact.     Conjunctiva/sclera: Conjunctivae normal.  Cardiovascular:     Rate and Rhythm: Normal rate and regular rhythm.     Pulses: Normal pulses.     Heart sounds: Normal heart sounds. No murmur heard.    No friction rub. No gallop.  Pulmonary:     Effort: Pulmonary effort is normal. No respiratory distress.     Breath sounds: Normal breath sounds. No stridor. No wheezing, rhonchi or rales.  Chest:     Chest wall: No tenderness.  Abdominal:      General: There is no distension.     Palpations: Abdomen is soft.     Tenderness: There is no abdominal tenderness. There is no right CVA tenderness, left CVA tenderness or guarding.  Genitourinary:    General: Normal vulva.     Comments: No rashes, redness swelling or abnormal discharge noted Musculoskeletal:        General: Tenderness present. No swelling, deformity or signs of injury.     Right lower leg: No edema.     Left lower leg: No edema.     Comments: Tenderness on palpation of lower mid back area, patient able to ambulate without difficulty  Skin:    General: Skin is warm and dry.     Capillary Refill: Capillary refill takes less than 2 seconds.     Coloration: Skin is not jaundiced or pale.     Findings: No bruising, erythema or lesion.  Neurological:     Mental Status: She is alert and oriented to person, place, and time.     Motor: No weakness.     Coordination: Coordination normal.     Gait: Gait normal.  Psychiatric:        Mood and Affect: Mood normal.        Behavior: Behavior normal.        Thought Content: Thought content normal.        Judgment: Judgment normal.     No results found for any visits on 12/28/23.      Assessment & Plan:   Problem List Items Addressed This Visit       Cardiovascular and Mediastinum   Hemorrhoids   Encouraged to increase intake of fiber to prevent constipation - Ambulatory referral to Gastroenterology - hydrocortisone (ANUSOL-HC) 25 MG suppository; Place 1 suppository (25 mg total) rectally 2 (two) times daily for 6 days.  Dispense: 12 suppository; Refill: 0        Relevant Medications   hydrocortisone (ANUSOL-HC) 25 MG suppository   Other Relevant Orders   Ambulatory referral to Gastroenterology     Musculoskeletal and Integument   Strain of lumbar region   -Tylenol 650 mg every 6 hours as needed - Flexeril 10 mg twice daily as needed - use of heating pad and stretching exercises.   -Take prednisone with food  to help protect your stomach  - Lidocaine 5%  every 24 hours  as needed         Other   Screening for STD (sexually transmitted disease)   Patient requesting for STD screening, still maintaining 1 sexual partner for over 10 years - NuSwab Vaginitis Plus (VG+) - HSV 1 and 2 Ab, IgG - HepB+HepC+HIV Panel       Relevant Orders   NuSwab Vaginitis Plus (VG+)   HSV 1 and 2 Ab, IgG   HepB+HepC+HIV Panel   Dark stools - Primary   Could be  due to the multiple vitamin that she is taking but she states that she does not take the multiple vitamins daily and would like referral to GI Referral placed Checking CBC      Relevant Orders   Ambulatory referral to Gastroenterology   CBC   Vaginal irritation   Avoid using harsh soap and sponges - NuSwab Vaginitis Plus (VG+)       Relevant Orders   NuSwab Vaginitis Plus (VG+)    Meds ordered this encounter  Medications   hydrocortisone (ANUSOL-HC) 25 MG suppository    Sig: Place 1 suppository (25 mg total) rectally 2 (two) times daily for 6 days.    Dispense:  12 suppository    Refill:  0    No follow-ups on file.  Evette Diclemente R Brittony Billick, FNP

## 2023-12-28 NOTE — Patient Instructions (Addendum)
 Please take Tylenol 650 mg every 6 hours as needed, take Flexeril 10 mg twice daily as needed I encourage use of heating pad and stretching exercises.  Take prednisone with food to help protect your stomach . Lidocaine 5% patch as ordered is also recommended   1. Dark stools (Primary)  - Ambulatory referral to Gastroenterology  2. Hemorrhoids, unspecified hemorrhoid type  - Ambulatory referral to Gastroenterology - hydrocortisone (ANUSOL-HC) 25 MG suppository; Place 1 suppository (25 mg total) rectally 2 (two) times daily for 6 days. Do not use for than 6 days at once.    3. Vaginal irritation  - NuSwab Vaginitis Plus (VG+)  4. Strain of lumbar region, subsequent encounter    It is important that you exercise regularly at least 30 minutes 5 times a week as tolerated  Think about what you will eat, plan ahead. Choose " clean, green, fresh or frozen" over canned, processed or packaged foods which are more sugary, salty and fatty. 70 to 75% of food eaten should be vegetables and fruit. Three meals at set times with snacks allowed between meals, but they must be fruit or vegetables. Aim to eat over a 12 hour period , example 7 am to 7 pm, and STOP after  your last meal of the day. Drink water,generally about 64 ounces per day, no other drink is as healthy. Fruit juice is best enjoyed in a healthy way, by EATING the fruit.  Thanks for choosing [facility] we consider it a privelige to serve you.

## 2023-12-28 NOTE — Assessment & Plan Note (Signed)
 Encouraged to increase intake of fiber to prevent constipation - Ambulatory referral to Gastroenterology - hydrocortisone (ANUSOL-HC) 25 MG suppository; Place 1 suppository (25 mg total) rectally 2 (two) times daily for 6 days.  Dispense: 12 suppository; Refill: 0

## 2023-12-28 NOTE — Assessment & Plan Note (Addendum)
 Could be due to the multiple vitamin that she is taking but she states that she does not take the multiple vitamins daily and would like referral to GI Referral placed Checking CBC

## 2023-12-29 LAB — HSV 1 AND 2 AB, IGG
HSV 1 Glycoprotein G Ab, IgG: NONREACTIVE
HSV 2 IgG, Type Spec: NONREACTIVE

## 2023-12-29 LAB — CBC
Hematocrit: 38.7 % (ref 34.0–46.6)
Hemoglobin: 12.7 g/dL (ref 11.1–15.9)
MCH: 30.6 pg (ref 26.6–33.0)
MCHC: 32.8 g/dL (ref 31.5–35.7)
MCV: 93 fL (ref 79–97)
Platelets: 232 10*3/uL (ref 150–450)
RBC: 4.15 x10E6/uL (ref 3.77–5.28)
RDW: 13.7 % (ref 11.7–15.4)
WBC: 7.4 10*3/uL (ref 3.4–10.8)

## 2023-12-29 LAB — HEPB+HEPC+HIV PANEL
HIV Screen 4th Generation wRfx: NONREACTIVE
Hep B C IgM: NEGATIVE
Hep B Core Total Ab: NEGATIVE
Hep B E Ab: NONREACTIVE
Hep B E Ag: NEGATIVE
Hep B Surface Ab, Qual: REACTIVE
Hep C Virus Ab: NONREACTIVE
Hepatitis B Surface Ag: NEGATIVE

## 2023-12-31 LAB — NUSWAB VAGINITIS PLUS (VG+)
Candida albicans, NAA: NEGATIVE
Candida glabrata, NAA: NEGATIVE
Chlamydia trachomatis, NAA: NEGATIVE
Neisseria gonorrhoeae, NAA: NEGATIVE
Trich vag by NAA: NEGATIVE

## 2024-02-22 ENCOUNTER — Encounter: Payer: Self-pay | Admitting: Nurse Practitioner

## 2024-02-22 ENCOUNTER — Telehealth (INDEPENDENT_AMBULATORY_CARE_PROVIDER_SITE_OTHER): Payer: MEDICAID | Admitting: Nurse Practitioner

## 2024-02-22 DIAGNOSIS — R197 Diarrhea, unspecified: Secondary | ICD-10-CM

## 2024-02-22 DIAGNOSIS — J111 Influenza due to unidentified influenza virus with other respiratory manifestations: Secondary | ICD-10-CM

## 2024-02-22 MED ORDER — LOPERAMIDE HCL 2 MG PO TABS
2.0000 mg | ORAL_TABLET | Freq: Four times a day (QID) | ORAL | 0 refills | Status: DC | PRN
Start: 2024-02-22 — End: 2024-05-28

## 2024-02-22 NOTE — Progress Notes (Signed)
 Virtual Visit via video Note  I connected with Vaishali Baise @ on 02/22/24 at  1:40 PM EDT by telephone and verified that I am speaking with the correct person using two identifiers. I spent 7 minutes on this video encounter.   Location: Patient: home Provider: office   I discussed the limitations, risks, security and privacy concerns of performing an evaluation and management service by telephone and the availability of in person appointments. I also discussed with the patient that there may be a patient responsible charge related to this service. The patient expressed understanding and agreed to proceed.   History of Present Illness: Ms Candice Farrell  has a past medical history of Anxiety, Back pain, Back pain without sciatica (10/21/2023), Depression, Gestational diabetes, Hemorrhoids (12/28/2023), PCOS (polycystic ovarian syndrome), PONV (postoperative nausea and vomiting), Prediabetes (10/21/2023), Scoliosis, and Strain of lumbar region (12/07/2023).  Patient presents with complaints of bodyaches, cough, headaches, diarrhea that started 4 days ago, had home COVID/flu test done which was positive for flu.  She has been taking OTC cough medication that helps her cough, still body aches and 5-6 diarrhea daily.  Not sure of her temperature but states that her body feels warm .she is up-to-date with flu vaccine.  Stated that her job will not let her back to work until after 7 days of testing positive for flu, needs a work note to go back to work next week Monday.   Observations/Objective:   Assessment and Plan: Diarrhea  - loperamide (IMODIUM A-D) 2 MG tablet; Take 1 tablet (2 mg total) by mouth 4 (four) times daily as needed for diarrhea or loose stools.  Dispense: 15 tablet; Refill: 0 Patient encouraged to stay hydrated by drinking at least 64 ounces of water  daily   Influenza Will not treat with Tamiflu since she tested positive 4 days ago Take Tylenol  650 mg every 6 hours for fever, body  aches Continue OTC cough medications Encouraged to maintain hydration   Follow Up Instructions:    I discussed the assessment and treatment plan with the patient. The patient was provided an opportunity to ask questions and all were answered. The patient agreed with the plan and demonstrated an understanding of the instructions.   The patient was advised to call back or seek an in-person evaluation if the symptoms worsen or if the condition fails to improve as anticipated.

## 2024-02-22 NOTE — Patient Instructions (Signed)
 1. Diarrhea, unspecified type (Primary)  - loperamide (IMODIUM A-D) 2 MG tablet; Take 1 tablet (2 mg total) by mouth 4 (four) times daily as needed for diarrhea or loose stools.  Dispense: 15 tablet; Refill: 0  Please take Tylenol  650 mg every 6 hours as needed for fever, body aches Continue over-the-counter cough medication as needed Drink at least 64 ounces of water  daily to maintain hydration   It is important that you exercise regularly at least 30 minutes 5 times a week as tolerated  Think about what you will eat, plan ahead. Choose  clean, green, fresh or frozen over canned, processed or packaged foods which are more sugary, salty and fatty. 70 to 75% of food eaten should be vegetables and fruit. Three meals at set times with snacks allowed between meals, but they must be fruit or vegetables. Aim to eat over a 12 hour period , example 7 am to 7 pm, and STOP after  your last meal of the day. Drink water ,generally about 64 ounces per day, no other drink is as healthy. Fruit juice is best enjoyed in a healthy way, by EATING the fruit.  Thanks for choosing [facility] we consider it a privelige to serve you.

## 2024-02-22 NOTE — Assessment & Plan Note (Signed)
-   loperamide (IMODIUM A-D) 2 MG tablet; Take 1 tablet (2 mg total) by mouth 4 (four) times daily as needed for diarrhea or loose stools.  Dispense: 15 tablet; Refill: 0 Patient encouraged to stay hydrated by drinking at least 64 ounces of water  daily

## 2024-02-22 NOTE — Assessment & Plan Note (Signed)
 Will not treat with Tamiflu since she tested positive 4 days ago Take Tylenol  650 mg every 6 hours for fever, body aches Continue OTC cough medications Encouraged to maintain hydration

## 2024-03-28 ENCOUNTER — Telehealth (INDEPENDENT_AMBULATORY_CARE_PROVIDER_SITE_OTHER): Payer: MEDICAID | Admitting: Nurse Practitioner

## 2024-03-28 ENCOUNTER — Encounter: Payer: Self-pay | Admitting: Nurse Practitioner

## 2024-03-28 VITALS — Ht 59.0 in | Wt 195.0 lb

## 2024-03-28 DIAGNOSIS — S39012D Strain of muscle, fascia and tendon of lower back, subsequent encounter: Secondary | ICD-10-CM

## 2024-03-30 NOTE — Progress Notes (Signed)
 Not seen

## 2024-04-03 ENCOUNTER — Ambulatory Visit: Payer: MEDICAID | Admitting: Nurse Practitioner

## 2024-04-06 ENCOUNTER — Telehealth: Payer: MEDICAID | Admitting: Nurse Practitioner

## 2024-04-06 ENCOUNTER — Telehealth: Payer: Self-pay | Admitting: Nurse Practitioner

## 2024-04-06 VITALS — Ht 59.0 in | Wt 190.8 lb

## 2024-04-06 DIAGNOSIS — R195 Other fecal abnormalities: Secondary | ICD-10-CM

## 2024-04-06 DIAGNOSIS — M25532 Pain in left wrist: Secondary | ICD-10-CM | POA: Diagnosis not present

## 2024-04-06 DIAGNOSIS — M25561 Pain in right knee: Secondary | ICD-10-CM

## 2024-04-06 MED ORDER — CYCLOBENZAPRINE HCL 10 MG PO TABS: 10.0000 mg | ORAL_TABLET | Freq: Two times a day (BID) | ORAL | 0 refills | Status: DC | PRN

## 2024-04-06 MED ORDER — DICLOFENAC SODIUM 1 % EX GEL
4.0000 g | Freq: Four times a day (QID) | CUTANEOUS | 1 refills | Status: DC
Start: 2024-04-06 — End: 2024-05-28

## 2024-04-06 NOTE — Patient Instructions (Addendum)
 Please call [facility] regarding the referral that was sent for your rectal bleeding [address], [place], [place] [address]  Phone: [phone]   Please take Tylenol  650 mg every 6 hours as needed, Flexeril  has been refilled, Voltaren gel ordered.  I encouraged the use of knee brace can get this at your pharmacy, application of heat and stretching exercises also encouraged.   1. Acute pain of left wrist (Primary)  - cyclobenzaprine  (FLEXERIL ) 10 MG tablet; Take 1 tablet (10 mg total) by mouth 2 (two) times daily as needed for muscle spasms.  Dispense: 20 tablet; Refill: 0 Voltaren gel.  Apply 2 g 4 times daily  2. Acute pain of right knee  - cyclobenzaprine  (FLEXERIL ) 10 MG tablet; Take 1 tablet (10 mg total) by mouth 2 (two) times daily as needed for muscle spasms.  Dispense: 20 tablet; Refill: 0 - diclofenac Sodium (VOLTAREN) 1 % GEL; Apply 4 g topically 4 (four) times daily.  Dispense: 100 g; Refill: 1   It is important that you exercise regularly at least 30 minutes 5 times a week as tolerated  Think about what you will eat, plan ahead. Choose  clean, green, fresh or frozen over canned, processed or packaged foods which are more sugary, salty and fatty. 70 to 75% of food eaten should be vegetables and fruit. Three meals at set times with snacks allowed between meals, but they must be fruit or vegetables. Aim to eat over a 12 hour period , example 7 am to 7 pm, and STOP after  your last meal of the day. Drink water ,generally about 64 ounces per day, no other drink is as healthy. Fruit juice is best enjoyed in a healthy way, by EATING the fruit.  Thanks for choosing [facility] we consider it a privelige to serve you.

## 2024-04-06 NOTE — Assessment & Plan Note (Signed)
.   Acute pain of right knee  - cyclobenzaprine  (FLEXERIL ) 10 MG tablet; Take 1 tablet (10 mg total) by mouth 2 (two) times daily as needed for muscle spasms.  Dispense: 20 tablet; Refill: 0 - diclofenac Sodium (VOLTAREN) 1 % GEL; Apply 4 g topically 4 (four) times daily.  Dispense: 100 g; Refill: 1  Encouraged application of heat, stretching exercises, use of knee brace, Tylenol  650 mg every 6 hours as needed, Flexeril  10 mg twice daily as needed

## 2024-04-06 NOTE — Progress Notes (Signed)
 Virtual Visit via Video Note  I connected with Candice Farrell @ on 04/06/24 at  8:20 AM EDT by video and verified that I am speaking with the correct person using two identifiers.  Location: Patient: home Provider: office   I discussed the limitations, risks, security and privacy concerns of performing an evaluation and management service by telephone and the availability of in person appointments. I also discussed with the patient that there may be a patient responsible charge related to this service. The patient expressed understanding and agreed to proceed.   History of Present Illness: Candice Farrell  has a past medical history of Anxiety, Back pain, Back pain without sciatica (10/21/2023), Depression, Gestational diabetes, Hemorrhoids (12/28/2023), PCOS (polycystic ovarian syndrome), PONV (postoperative nausea and vomiting), Prediabetes (10/21/2023), Scoliosis, and Strain of lumbar region (12/07/2023).  Patient presents with complaints of acute right knee pain and left wrist pain  Right knee pain.  Started about 2 to 3 weeks ago currently rated 7/10 she has not taken any medication for her pain crisscrossing her leg makes it worse.  She denies numbness, tingling, trauma, swelling.  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  Patient complains of left wrist pain that started yesterday.  Has aching pain rated 8/10.  She denies numbness, tingling, swelling, trauma  Observations/Objective: Patient alert and oriented no sign of acute distress noted  Assessment and Plan: Dark stools [facility] phone number provided Advised to schedule an appointment  Acute pain of right knee . Acute pain of right knee  - cyclobenzaprine  (FLEXERIL ) 10 MG tablet; Take 1 tablet (10 mg total) by mouth 2 (two) times daily as needed for muscle spasms.  Dispense: 20 tablet; Refill: 0 - diclofenac Sodium (VOLTAREN) 1 % GEL;  Apply 4 g topically 4 (four) times daily.  Dispense: 100 g; Refill: 1  Encouraged application of heat, stretching exercises, use of knee brace, Tylenol  650 mg every 6 hours as needed, Flexeril  10 mg twice daily as needed  Acute pain of left wrist Encouraged application of heat, stretching exercises, use of knee brace, Tylenol  650 mg every 6 hours as needed, Flexeril  10 mg twice daily as needed 1. Acute pain of left wrist (Primary)  - cyclobenzaprine  (FLEXERIL ) 10 MG tablet; Take 1 tablet (10 mg total) by mouth 2 (two) times daily as needed for muscle spasms.  Dispense: 20 tablet; Refill: 0 - diclofenac Sodium (VOLTAREN) 1 % GEL; Apply 2 g topically 4 (four) times daily.  Dispense: 100 g; Refill: 1     Follow Up Instructions:    I discussed the assessment and treatment plan with the patient. The patient was provided an opportunity to ask questions and all were answered. The patient agreed with the plan and demonstrated an understanding of the instructions.   The patient was advised to call back or seek an in-person evaluation if the symptoms worsen or if the condition fails to improve as anticipated.

## 2024-04-06 NOTE — Assessment & Plan Note (Signed)
 [facility] phone number provided Advised to schedule an appointment

## 2024-04-06 NOTE — Assessment & Plan Note (Signed)
 Encouraged application of heat, stretching exercises, use of knee brace, Tylenol  650 mg every 6 hours as needed, Flexeril  10 mg twice daily as needed 1. Acute pain of left wrist (Primary)  - cyclobenzaprine  (FLEXERIL ) 10 MG tablet; Take 1 tablet (10 mg total) by mouth 2 (two) times daily as needed for muscle spasms.  Dispense: 20 tablet; Refill: 0 - diclofenac Sodium (VOLTAREN) 1 % GEL; Apply 2 g topically 4 (four) times daily.  Dispense: 100 g; Refill: 1

## 2024-05-08 ENCOUNTER — Ambulatory Visit: Payer: Self-pay

## 2024-05-08 ENCOUNTER — Telehealth (INDEPENDENT_AMBULATORY_CARE_PROVIDER_SITE_OTHER): Payer: MEDICAID | Admitting: Nurse Practitioner

## 2024-05-08 ENCOUNTER — Encounter: Payer: Self-pay | Admitting: Nurse Practitioner

## 2024-05-08 VITALS — Ht 59.0 in | Wt 190.0 lb

## 2024-05-08 DIAGNOSIS — R109 Unspecified abdominal pain: Secondary | ICD-10-CM | POA: Diagnosis not present

## 2024-05-08 DIAGNOSIS — R7303 Prediabetes: Secondary | ICD-10-CM | POA: Diagnosis not present

## 2024-05-08 DIAGNOSIS — R197 Diarrhea, unspecified: Secondary | ICD-10-CM | POA: Diagnosis not present

## 2024-05-08 DIAGNOSIS — E669 Obesity, unspecified: Secondary | ICD-10-CM

## 2024-05-08 MED ORDER — PHENTERMINE HCL 15 MG PO CAPS: 15.0000 mg | ORAL_CAPSULE | ORAL | 0 refills | Status: DC

## 2024-05-08 NOTE — Assessment & Plan Note (Signed)
 Lab Results  Component Value Date   HGBA1C 5.7 (H) 10/21/2023    Prediabetes with previous metformin use discontinued due to gastrointestinal side effects. Focus on weight management and dietary changes to control blood sugar. - Continue dietary modifications to manage blood sugar levels.

## 2024-05-08 NOTE — Progress Notes (Signed)
 Virtual Visit via Telephone Note  I connected with Candice Farrell @ on 05/08/24 at 1151 am by telephone and verified that I am speaking with the correct person using two identifiers. I spent 11 minutes on this video  encounter  Location: Patient: home Provider: office   I discussed the limitations, risks, security and privacy concerns of performing an evaluation and management service by telephone and the availability of in person appointments. I also discussed with the patient that there may be a patient responsible charge related to this service. The patient expressed understanding and agreed to proceed.   History of Present Illness: Discussed the use of AI scribe software for clinical note transcription with the patient, who gave verbal consent to proceed.  History of Present Illness Candice Farrell is a 28 year old female  has a past medical history of Anxiety, Back pain, Back pain without sciatica (10/21/2023), Depression, Gestational diabetes, Hemorrhoids (12/28/2023), PCOS (polycystic ovarian syndrome), PONV (postoperative nausea and vomiting), Prediabetes (10/21/2023), Scoliosis, and Strain of lumbar region (12/07/2023).  who presents with abdominal pain and fluctuating bowel movements.  She has been experiencing abdominal pain and fluctuating bowel movements for the past two to three weeks. The pain is accompanied by a sensation of incomplete evacuation after bowel movements. Her bowel habits vary throughout the day, with episodes of diarrhea in the morning, normal stools in the afternoon, and alternating diarrhea and constipation in the evening.  She has been unable to schedule an appointment with a gastroenterologist despite multiple attempts to contact her office. She occasionally uses suppositories and MiraLAX for constipation relief, though she tries to avoid frequent use of MiraLAX.  She has a history of prediabetes and was previously on metformin, which was discontinued due to  exacerbation of her stomach pain.  She is actively trying to lose weight and has been making dietary changes, such as increasing protein intake and reducing sugar and carbohydrate consumption.    Assessment & Plan      Observations/Objective: Patient alert and oriented no sign of distress noted  Assessment and Plan: Obesity (BMI 30-39.9) Wt Readings from Last 3 Encounters:  05/08/24 190 lb (86.2 kg)  04/06/24 190 lb 12.8 oz (86.5 kg)  03/28/24 195 lb (88.5 kg)  Body mass index is 38.38 kg/m.   Difficulty with weight loss despite lifestyle changes. Discussed weight loss medications. Chose phentermine  due to gastrointestinal issues, avoiding Wegovy. Phentermine  may cause palpitations and increased blood pressure, but her blood pressure is well-managed. - Prescribe phentermine  15 mg daily. - Monitor for side effects such as palpitations and increased blood pressure. - Recommend moderate to vigorous exercise 30 minutes five days a week. - Advise dietary changes focusing on high protein and vegetable intake with portion control. - Schedule follow-up in four weeks to assess response and side effects.  Abdominal pain Abdominal pain with alternating bowel habits and anal spasm Persistent abdominal pain with fluctuating bowel habits. Difficulty scheduling gastroenterology appointment. Differential includes gastrointestinal disorders requiring further evaluation. - Nurse to contact gastroenterologist for appointment. - Advise to increase intake of fiber, take  Miralax as needed for constipation. - Recommend increased vegetable intake and hydration. - Consider over-the-counter stool softeners if needed.  Prediabetes Lab Results  Component Value Date   HGBA1C 5.7 (H) 10/21/2023    Prediabetes with previous metformin use discontinued due to gastrointestinal side effects. Focus on weight management and dietary changes to control blood sugar. - Continue dietary modifications to manage  blood sugar levels.    Diarrhea  Please to schedule an appointment with GI   Follow Up Instructions:    I discussed the assessment and treatment plan with the patient. The patient was provided an opportunity to ask questions and all were answered. The patient agreed with the plan and demonstrated an understanding of the instructions.   The patient was advised to call back or seek an in-person evaluation if the symptoms worsen or if the condition fails to improve as anticipated.

## 2024-05-08 NOTE — Patient Instructions (Addendum)
 [facility]  [address] [place] [address], [place], [place] [address] Phone: [phone]    1. Obesity (BMI 30-39.9) (Primary)  - phentermine  15 MG capsule; Take 1 capsule (15 mg total) by mouth every morning.  Dispense: 30 capsule; Refill: 0     It is important that you exercise regularly at least 30 minutes 5 times a week as tolerated  Think about what you will eat, plan ahead. Choose  clean, green, fresh or frozen over canned, processed or packaged foods which are more sugary, salty and fatty. 70 to 75% of food eaten should be vegetables and fruit. Three meals at set times with snacks allowed between meals, but they must be fruit or vegetables. Aim to eat over a 12 hour period , example 7 am to 7 pm, and STOP after  your last meal of the day. Drink water ,generally about 64 ounces per day, no other drink is as healthy. Fruit juice is best enjoyed in a healthy way, by EATING the fruit.  Thanks for choosing [facility] we consider it a privelige to serve you.

## 2024-05-08 NOTE — Assessment & Plan Note (Signed)
 Abdominal pain with alternating bowel habits and anal spasm Persistent abdominal pain with fluctuating bowel habits. Difficulty scheduling gastroenterology appointment. Differential includes gastrointestinal disorders requiring further evaluation. - Nurse to contact gastroenterologist for appointment. - Advise to increase intake of fiber, take  Miralax as needed for constipation. - Recommend increased vegetable intake and hydration. - Consider over-the-counter stool softeners if needed.

## 2024-05-08 NOTE — Assessment & Plan Note (Addendum)
 Wt Readings from Last 3 Encounters:  05/08/24 190 lb (86.2 kg)  04/06/24 190 lb 12.8 oz (86.5 kg)  03/28/24 195 lb (88.5 kg)  Body mass index is 38.38 kg/m.   Difficulty with weight loss despite lifestyle changes. Discussed weight loss medications. Chose phentermine  due to gastrointestinal issues, avoiding Wegovy. Phentermine  may cause palpitations and increased blood pressure, but her blood pressure is well-managed. - Prescribe phentermine  15 mg daily. - Monitor for side effects such as palpitations and increased blood pressure. - Recommend moderate to vigorous exercise 30 minutes five days a week. - Advise dietary changes focusing on high protein and vegetable intake with portion control. - Schedule follow-up in four weeks to assess response and side effects.

## 2024-05-08 NOTE — Assessment & Plan Note (Signed)
 Please to schedule an appointment with GI

## 2024-05-08 NOTE — Telephone Encounter (Signed)
 FYI Only or Action Required?: Action required by provider: referral request and update on patient condition.  Patient was last seen in primary care on 04/06/2024 by Paseda, Folashade R, FNP.  Called Nurse Triage reporting Abdominal Pain.  Symptoms began about a month ago.  Interventions attempted: OTC medications: miralax, enemas, suppositories, immodium.  Symptoms are: unchanged.  Triage Disposition: See PCP Within 2 Weeks  Patient/caregiver understands and will follow disposition?: Yes  Copied from CRM [id number]. Topic: Clinical - Red Word Triage >> May 08, 2024 10:04 AM Marissa P wrote: Red Word that prompted transfer to Nurse Triage: Patient having abdominal pain and having trouble with her bowels. Would like a virtual appt. Reason for Disposition  Abdominal pain is a chronic symptom (recurrent or ongoing AND present > 4 weeks)  Answer Assessment - Initial Assessment Questions 1. LOCATION: Where does it hurt?      Left upper and lower abdomen 2. RADIATION: Does the pain shoot anywhere else? (e.g., chest, back)     Radiates to back and hips at imes 3. ONSET: When did the pain begin? (e.g., minutes, hours or days ago)      About a month ago 4. SUDDEN: Gradual or sudden onset?     sudden 5. PATTERN Does the pain come and go, or is it constant?     Pain is constant, but comes and goes in severity 6. SEVERITY: How bad is the pain?  (e.g., Scale 1-10; mild, moderate, or severe)     At time of call, 3/10 7. RECURRENT SYMPTOM: Have you ever had this type of stomach pain before? If Yes, ask: When was the last time? and What happened that time?      Yes, abdominal pain since 2023 and pain from constipation. Patient also has hemorrhoids 8. CAUSE: What do you think is causing the stomach pain? (e.g., gallstones, recent abdominal surgery)     unsure 9. RELIEVING/AGGRAVATING FACTORS: What makes it better or worse? (e.g., antacids, bending or twisting motion, bowel  movement)     Patient has tried suppositories, enemas, miralax, dietary changes 10. OTHER SYMPTOMS: Do you have any other symptoms? (e.g., back pain, diarrhea, fever, urination pain, vomiting)       Patient has both constipation and diarrhea 11. PREGNANCY: Is there any chance you are pregnant? When was your last menstrual period?       PCOS, irregular, 05/08/2024  Protocols used: Abdominal Pain - Female-A-AH

## 2024-05-10 NOTE — Telephone Encounter (Signed)
 Pt was seen on virtual visit. Kh

## 2024-05-27 ENCOUNTER — Emergency Department (HOSPITAL_BASED_OUTPATIENT_CLINIC_OR_DEPARTMENT_OTHER): Payer: MEDICAID

## 2024-05-27 ENCOUNTER — Encounter (HOSPITAL_BASED_OUTPATIENT_CLINIC_OR_DEPARTMENT_OTHER): Payer: Self-pay

## 2024-05-27 ENCOUNTER — Other Ambulatory Visit: Payer: Self-pay

## 2024-05-27 ENCOUNTER — Emergency Department (HOSPITAL_BASED_OUTPATIENT_CLINIC_OR_DEPARTMENT_OTHER)
Admission: EM | Admit: 2024-05-27 | Discharge: 2024-05-28 | Disposition: A | Discharge status: Home / Self Care | Payer: MEDICAID | Attending: Emergency Medicine | Admitting: Emergency Medicine

## 2024-05-27 DIAGNOSIS — D72829 Elevated white blood cell count, unspecified: Secondary | ICD-10-CM | POA: Insufficient documentation

## 2024-05-27 DIAGNOSIS — R1032 Left lower quadrant pain: Secondary | ICD-10-CM | POA: Insufficient documentation

## 2024-05-27 DIAGNOSIS — Z79899 Other long term (current) drug therapy: Secondary | ICD-10-CM | POA: Diagnosis not present

## 2024-05-27 DIAGNOSIS — R110 Nausea: Secondary | ICD-10-CM | POA: Diagnosis not present

## 2024-05-27 LAB — COMPREHENSIVE METABOLIC PANEL WITH GFR
ALT: 27 U/L (ref 0–44)
AST: 26 U/L (ref 15–41)
Albumin: 4.6 g/dL (ref 3.5–5.0)
Alkaline Phosphatase: 62 U/L (ref 38–126)
Anion gap: 14 (ref 5–15)
BUN: 6 mg/dL (ref 6–20)
CO2: 20 mmol/L — ABNORMAL LOW (ref 22–32)
Calcium: 9.9 mg/dL (ref 8.9–10.3)
Chloride: 102 mmol/L (ref 98–111)
Creatinine, Ser: 0.69 mg/dL (ref 0.44–1.00)
GFR, Estimated: >60 mL/min (ref >60)
Glucose, Bld: 98 mg/dL (ref 70–99)
Potassium: 3.8 mmol/L (ref 3.5–5.1)
Sodium: 137 mmol/L (ref 135–145)
Total Bilirubin: 0.4 mg/dL (ref 0.0–1.2)
Total Protein: 7.6 g/dL (ref 6.5–8.1)

## 2024-05-27 LAB — CBC
HCT: 39.7 % (ref 36.0–46.0)
Hemoglobin: 13 g/dL (ref 12.0–15.0)
MCH: 31.6 pg (ref 26.0–34.0)
MCHC: 32.7 g/dL (ref 30.0–36.0)
MCV: 96.6 fL (ref 80.0–100.0)
Platelets: 214 K/uL (ref 150–400)
RBC: 4.11 MIL/uL (ref 3.87–5.11)
RDW: 14.6 % (ref 11.5–15.5)
WBC: 10.9 K/uL — ABNORMAL HIGH (ref 4.0–10.5)
nRBC: 0 % (ref 0.0–0.2)

## 2024-05-27 LAB — LIPASE, BLOOD: Lipase: 16 U/L (ref 11–51)

## 2024-05-27 LAB — URINALYSIS, ROUTINE W REFLEX MICROSCOPIC
Bilirubin Urine: NEGATIVE
Glucose, UA: NEGATIVE mg/dL
Hgb urine dipstick: NEGATIVE
Ketones, ur: NEGATIVE mg/dL
Leukocytes,Ua: NEGATIVE
Nitrite: NEGATIVE
Protein, ur: NEGATIVE mg/dL
Specific Gravity, Urine: 1.021 (ref 1.005–1.030)
pH: 6.5 (ref 5.0–8.0)

## 2024-05-27 LAB — PREGNANCY, URINE: Preg Test, Ur: NEGATIVE

## 2024-05-27 MED ORDER — HYDROMORPHONE HCL 1 MG/ML IJ SOLN
0.5000 mg | Freq: Once | INTRAMUSCULAR | Status: AC
  Administered 2024-05-28: 0.5 mg via INTRAVENOUS
  Filled 2024-05-27: qty 1

## 2024-05-27 MED ORDER — ONDANSETRON HCL 4 MG/2ML IJ SOLN
4.0000 mg | Freq: Once | INTRAMUSCULAR | Status: AC
  Administered 2024-05-28: 4 mg via INTRAVENOUS
  Filled 2024-05-27: qty 2

## 2024-05-27 NOTE — ED Triage Notes (Signed)
 Pt from home via [facility] abd pain x4 days, constant Hx diverticulitis, pain is sharp/ grabbing 9/10, radiates to back, LLE.  CP worse on palp, denies cardiac hx

## 2024-05-27 NOTE — ED Provider Notes (Addendum)
 [place] [facility] AT [facility] Provider Note   CSN: [id number] Arrival date & time: 05/27/24  2023     Patient presents with: Abdominal Pain   Candice Farrell is a 28 y.o. female.   The history is provided by the patient.  Abdominal Pain  She complains of left lower quadrant pain which has been intermittent over the last several years but has been much worse today.  Pain radiates to the her rectum.  She has had nausea but no vomiting.  She denies fever or chills.  She denies any urinary urgency or frequency or tenesmus or dysuria.  Last menses was sometime in August but she is not sure because her menses are regular.  She denies any vaginal discharge.  She has taken ibuprofen  without relief.  She is not using any form of contraception.  She states that she did have 1 episode of diverticulitis in the past.    Prior to Admission medications   Medication Sig Start Date End Date Taking? Authorizing Provider  atomoxetine  (STRATTERA ) 25 MG capsule Take 1 capsule (25 mg total) by mouth daily. Patient not taking: Reported on 05/08/2024 10/21/23   Paseda, Folashade R, FNP  cyclobenzaprine  (FLEXERIL ) 10 MG tablet Take 1 tablet (10 mg total) by mouth 2 (two) times daily as needed for muscle spasms. Patient not taking: Reported on 05/08/2024 04/06/24   Paseda, Folashade R, FNP  diclofenac  Sodium (VOLTAREN ) 1 % GEL Apply 4 g topically 4 (four) times daily. Patient not taking: Reported on 05/08/2024 04/06/24   Paseda, Folashade R, FNP  DM-Doxylamine-Acetaminophen  (NYQUIL COLD & FLU PO) Take by mouth. Patient not taking: Reported on 05/08/2024    [provider]  hydrOXYzine  (ATARAX ) 25 MG tablet Take 1 tablet (25 mg total) by mouth 3 (three) times daily as needed. Patient not taking: Reported on 05/08/2024 01/14/23   Harl Zane BRAVO, NP  lidocaine  (LIDODERM ) 5 % Place 1 patch onto the skin daily. Remove & Discard patch within 12 hours or as directed by MD Patient not  taking: Reported on 05/08/2024 12/03/23   Jerrol Agent, MD  loperamide  (IMODIUM  A-D) 2 MG tablet Take 1 tablet (2 mg total) by mouth 4 (four) times daily as needed for diarrhea or loose stools. Patient not taking: Reported on 05/08/2024 02/22/24   Paseda, Folashade R, FNP  Omega-3 Fatty Acids (FISH OIL) 300 MG CAPS Take by mouth. Patient not taking: Reported on 05/08/2024    [provider]  OVER THE COUNTER MEDICATION     [provider]  phentermine  15 MG capsule Take 1 capsule (15 mg total) by mouth every morning. 05/08/24   Paseda, Folashade R, FNP  Prenatal Vit-Fe Fumarate-FA (MULTIVITAMIN-PRENATAL) 27-0.8 MG TABS tablet Take 1 tablet by mouth daily at 12 noon. Patient not taking: Reported on 05/08/2024    [provider]  traZODone  (DESYREL ) 50 MG tablet Take 1 tablet (50 mg total) by mouth at bedtime as needed for sleep. Patient not taking: Reported on 05/08/2024 10/21/23   Paseda, Folashade R, FNP    Allergies: Morphine  and codeine    Review of Systems  Gastrointestinal:  Positive for abdominal pain.  All other systems reviewed and are negative.   Updated Vital Signs BP (!) 143/99 (BP Location: Right Arm)   Pulse 73   Temp 98 F (36.7 C)   Resp 18   SpO2 97%   Physical Exam Vitals and nursing note reviewed.   28 year old female, resting comfortably and in no  acute distress. Vital signs are significant for elevated blood pressure. Oxygen saturation is 97%, which is normal. Head is normocephalic and atraumatic. PERRLA, EOMI. Back is nontender and there is no CVA tenderness. Lungs are clear without rales, wheezes, or rhonchi. Chest is nontender. Heart has regular rate and rhythm without murmur. Abdomen is soft, flat, with moderate tenderness in the left mid and lower abdomen.  There is no rebound or guarding. Skin is warm and dry without rash. Neurologic: Mental status is normal, cranial nerves are intact, there are no motor or sensory deficits.  (all  labs ordered are listed, but only abnormal results are displayed) Labs Reviewed  COMPREHENSIVE METABOLIC PANEL WITH GFR - Abnormal; Notable for the following components:      Result Value   CO2 20 (*)    All other components within normal limits  CBC - Abnormal; Notable for the following components:   WBC 10.9 (*)    All other components within normal limits  URINALYSIS, ROUTINE W REFLEX MICROSCOPIC - Abnormal; Notable for the following components:   APPearance HAZY (*)    Bacteria, UA RARE (*)    All other components within normal limits  LIPASE, BLOOD  PREGNANCY, URINE    Radiology: CT ABDOMEN PELVIS W CONTRAST Result Date: 05/28/2024 EXAM: CT ABDOMEN AND PELVIS WITH CONTRAST 05/28/2024 12:19:59 AM TECHNIQUE: CT of the abdomen and pelvis was performed with the administration of intravenous contrast. Multiplanar reformatted images are provided for review. Automated exposure control, iterative reconstruction, and/or weight-based adjustment of the mA/kV was utilized to reduce the radiation dose to as low as reasonably achievable. COMPARISON: None available. CLINICAL HISTORY: Abdominal pain, acute, nonlocalized. Pt from home via [facility]; LLQ abd pain x4 days, constant; Hx diverticulitis, pain is sharp/ grabbing 9/10, radiates to back, LLE. FINDINGS: LOWER CHEST: No acute abnormality. LIVER: Moderate hepatic steatosis and mild hepatomegaly. GALLBLADDER AND BILE DUCTS: Gallbladder is unremarkable. No biliary ductal dilatation. SPLEEN: No acute abnormality. PANCREAS: No acute abnormality. ADRENAL GLANDS: No acute abnormality. KIDNEYS, URETERS AND BLADDER: No stones in the kidneys or ureters. No hydronephrosis. No perinephric or periureteral stranding. Urinary bladder is unremarkable. GI AND BOWEL: Stomach demonstrates no acute abnormality. There is no bowel obstruction. Appendix normal. PERITONEUM AND RETROPERITONEUM: Trace free fluid within the pelvis, nonspecific and possibly physiologic. No free  air. VASCULATURE: Aorta is normal in caliber. LYMPH NODES: No lymphadenopathy. REPRODUCTIVE ORGANS: No acute abnormality. BONES AND SOFT TISSUES: No acute osseous abnormality. No focal soft tissue abnormality. IMPRESSION: 1. No acute findings in the abdomen or pelvis. 2. Moderate hepatic steatosis and mild hepatomegaly. Electronically signed by: Dorethia Molt MD 05/28/2024 12:30 AM EDT RP Workstation: [id number]     Procedures   Medications Ordered in the ED - No data to display                                  Medical Decision Making Amount and/or Complexity of Data Reviewed Labs: ordered.  Risk Prescription drug management.   Left lower quadrant pain.  This a presentation with a wide range of treatment options and carries with it a high risk of morbidity and complications.  Differential diagnosis includes, but is not limited to, pyelonephritis, diverticulitis, urolithiasis, endometriosis.  I have reviewed her past records and note ED visit on 04/12/2021 for left lower quadrant pain at which time workup was negative including negative CT of abdomen and pelvis.  I have reviewed her laboratory  tests, and my interpretation is minimal leukocytosis and otherwise normal CBC, normal comprehensive metabolic panel, normal lipase, normal urinalysis.  CT of abdomen and pelvis shows no acute process.  I have independently viewed the images, and agree with the radiologist's interpretation.  I am discharging patient with a small number of hydrocodone -acetaminophen  tablets to use as needed for pain, and I am referring her to gastroenterology for further outpatient workup.  Return precautions discussed.       Final diagnoses:  LLQ abdominal pain    ED Discharge Orders          Ordered    HYDROcodone -acetaminophen  (NORCO/VICODIN) 5-325 MG tablet  Every 4 hours PRN        05/28/24 0054               Raford Lenis, MD 05/28/24 [id number]  Patient was very upset that I was not able to give her a  diagnosis and is concerned because she states she has tried to go to gastroenterologists in the past and has not been able to get an appointment.  She wants additional testing done here, I have advised her that I have done all of the testing that can be done through the emergency department.  I have encouraged her to follow up with the on-call gastroenterologist.  She was not happy with that answer.   Raford Lenis, MD 05/28/24 [phone]

## 2024-05-28 ENCOUNTER — Encounter: Payer: Self-pay | Admitting: Physician Assistant

## 2024-05-28 ENCOUNTER — Emergency Department (HOSPITAL_BASED_OUTPATIENT_CLINIC_OR_DEPARTMENT_OTHER): Payer: MEDICAID

## 2024-05-28 MED ORDER — IOHEXOL 350 MG/ML SOLN
100.0000 mL | Freq: Once | INTRAVENOUS | Status: AC | PRN
  Administered 2024-05-28: 90 mL via INTRAVENOUS

## 2024-05-28 MED ORDER — HYDROCODONE-ACETAMINOPHEN 5-325 MG PO TABS: 1.0000 | ORAL_TABLET | ORAL | 0 refills | Status: DC | PRN

## 2024-05-28 NOTE — Discharge Instructions (Signed)
 The cause for your pain was not clear based on laboratory tests and CT scan.  I recommend that you follow-up with a gastroenterologist for further evaluation of your pain.  In the meantime, I have prescribed some pain medication to take as needed.  However, if pain is getting worse, please feel free to return to the emergency department for reevaluation.

## 2024-05-28 NOTE — ED Notes (Signed)
 Pt tearful and requesting to speak with MD Raford. MD calmly explains workup and the need for specialty follow-up. Pain medication offered for home management until appointment can be made. Pt upset with treatment and does not think GI consult will call her back for an appointment. Pt exclaims she has been calling GI offices for 2 years and no one has called her back for appointments.This RN and MD speak to patient several times before leaving. Pt curses at staff and walks to lobby without incident. Pt then calls via registration to be transferred to another facility. Staff explains to pt that she is discharged and cannot be transferred- welcomed to check back in or call a ride. Offered a cab voucher home and family called. Spouse agrees to come pick up patient.

## 2024-05-28 NOTE — ED Notes (Signed)
 Patient transported to CT

## 2024-05-29 ENCOUNTER — Ambulatory Visit (INDEPENDENT_AMBULATORY_CARE_PROVIDER_SITE_OTHER)
Admission: RE | Admit: 2024-05-29 | Discharge: 2024-05-29 | Disposition: A | Discharge status: Home / Self Care | Payer: MEDICAID | Source: Ambulatory Visit | Attending: Physician Assistant | Admitting: Physician Assistant

## 2024-05-29 ENCOUNTER — Other Ambulatory Visit (INDEPENDENT_AMBULATORY_CARE_PROVIDER_SITE_OTHER): Payer: MEDICAID

## 2024-05-29 ENCOUNTER — Encounter: Payer: Self-pay | Admitting: Physician Assistant

## 2024-05-29 ENCOUNTER — Ambulatory Visit (INDEPENDENT_AMBULATORY_CARE_PROVIDER_SITE_OTHER): Payer: MEDICAID | Admitting: Physician Assistant

## 2024-05-29 VITALS — BP 138/78 | HR 76 | Ht 59.0 in | Wt 188.2 lb

## 2024-05-29 DIAGNOSIS — K760 Fatty (change of) liver, not elsewhere classified: Secondary | ICD-10-CM

## 2024-05-29 DIAGNOSIS — R198 Other specified symptoms and signs involving the digestive system and abdomen: Secondary | ICD-10-CM | POA: Diagnosis not present

## 2024-05-29 DIAGNOSIS — R1032 Left lower quadrant pain: Secondary | ICD-10-CM | POA: Diagnosis not present

## 2024-05-29 DIAGNOSIS — K6289 Other specified diseases of anus and rectum: Secondary | ICD-10-CM

## 2024-05-29 LAB — IBC + FERRITIN
Ferritin: 13.7 ng/mL (ref 10.0–291.0)
Iron: 71 ug/dL (ref 42–145)
Saturation Ratios: 14.2 % — ABNORMAL LOW (ref 20.0–50.0)
TIBC: 499.8 ug/dL — ABNORMAL HIGH (ref 250.0–450.0)
Transferrin: 357 mg/dL (ref 212.0–360.0)

## 2024-05-29 LAB — CBC WITH DIFFERENTIAL/PLATELET
Basophils Absolute: 0 K/uL (ref 0.0–0.1)
Basophils Relative: 0.5 % (ref 0.0–3.0)
Eosinophils Absolute: 0.1 K/uL (ref 0.0–0.7)
Eosinophils Relative: 1.1 % (ref 0.0–5.0)
HCT: 38.4 % (ref 36.0–46.0)
Hemoglobin: 12.7 g/dL (ref 12.0–15.0)
Lymphocytes Relative: 30.8 % (ref 12.0–46.0)
Lymphs Abs: 2.6 K/uL (ref 0.7–4.0)
MCHC: 33.1 g/dL (ref 30.0–36.0)
MCV: 95.8 fl (ref 78.0–100.0)
Monocytes Absolute: 0.5 K/uL (ref 0.1–1.0)
Monocytes Relative: 5.7 % (ref 3.0–12.0)
Neutro Abs: 5.3 K/uL (ref 1.4–7.7)
Neutrophils Relative %: 61.9 % (ref 43.0–77.0)
Platelets: 212 K/uL (ref 150.0–400.0)
RBC: 4.01 Mil/uL (ref 3.87–5.11)
RDW: 15.7 % — ABNORMAL HIGH (ref 11.5–15.5)
WBC: 8.6 K/uL (ref 4.0–10.5)

## 2024-05-29 LAB — GAMMA GT: GGT: 40 U/L (ref 7–51)

## 2024-05-29 LAB — C-REACTIVE PROTEIN: CRP: <1 mg/dL (ref 0.5–20.0)

## 2024-05-29 LAB — SEDIMENTATION RATE: Sed Rate: 11 mm/h (ref 0–20)

## 2024-05-29 MED ORDER — NA SULFATE-K SULFATE-MG SULF 17.5-3.13-1.6 GM/177ML PO SOLN: 1.0000 | Freq: Once | ORAL | 0 refills | Status: AC

## 2024-05-29 MED ORDER — HYOSCYAMINE SULFATE 0.125 MG PO TABS: 0.1250 mg | ORAL_TABLET | Freq: Four times a day (QID) | ORAL | 0 refills | Status: DC | PRN

## 2024-05-29 NOTE — Progress Notes (Signed)
 05/29/2024 Candice Farrell [id number] 1995/12/09  Referring provider: Paseda, Folashade R, FNP Primary GI doctor: Dr. San  ASSESSMENT AND PLAN:  LLQ pain x 2022 with pains into her rectum and back Has increasing bloating, alternating constipation/diarrhea, nausea without vomiting, sweating/flushed with previous history of diverticulitis however I am unable to find imaging, states was in 2022 but not on CT 05/27/2024 mild leukocytosis 10.9, no anemia normal platelets normal kidney liver 08/26/2022 CTAP W for abdominal pain moderate colonic stool burden 05/28/2024 CTAP W moderate hepatic steatosis and mild hepatomegaly gallbladder unremarkable unremarkable stomach and bowel. Possible constipation/IBS, pelvic floor, rule out celiac, IBD, malignancy, MSK from hip -Get Sed rate, CRP, celiac panel -Check H. pylori. -Will schedule for colonoscopy to evaluate, We have discussed the risks of bleeding, infection, perforation, medication reactions, and remote risk of death associated with colonoscopy. All questions were answered and the patient acknowledges these risk and wishes to proceed. - consider pelvic floor PT, information given -Can do trial of IBGARD daily, will give Levsin  -FODMAP,  and lifestyle changes discussed - salon pas patches for possible MSK,check hip xray  Anorectal pain/back pain associated with the LLQ pain may be caused by hemorrhoids, anal fissures, rectocele/pelvic floor/constipation/MSK from back or hip, rectal cancer, inflammatory bowel disease, pelvic inflammatory disease, and coccydynia.  Declines rectal -Colonoscopy recommended for further evaluation. -Recommend follow up with GYN - get Xray of hip for pain  Hepatic steatosis and hepatomegaly  seen on CTAP W 05/28/2024 Remote mild elevation of LFTs 08/2019 AST 43 ALT 63 normal alk phos T. 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  Must exclude other chronic causes of hepatocellular inflammation that can mimic fatty liver on ultrasound     Latest Ref Rng & Units 05/27/2024    8:32 PM 10/21/2023    9:40 AM 03/07/2022    4:52 AM  Hepatic Function  Total Protein 6.5 - 8.1 g/dL 7.6  6.6  6.2   Albumin 3.5 - 5.0 g/dL 4.6  4.4  2.8   AST 15 - 41 U/L 26  21  20    ALT 0 - 44 U/L 27  24  16    Alk Phosphatase 38 - 126 U/L 62  64  91   Total Bilirubin 0.0 - 1.2 mg/dL 0.4  0.2  0.3    Platelets 214  - Labs to include: hepatitis panel, iron, ferritin, TIBC,  IgG, ANA, Antismooth muscle antibody, AMA, celiac, thyroid , alpha-one-antitrypsin level - consider fibrosure - need LFTs and CBC monitored every 6 months, - revaluation with imaging every 2-3 years.  -Continue to work on risk factor modification including diet exercise and control of risk factors including blood sugars.  Morbid obesity  Body mass index is 38.02 kg/m.  -Patient has been advised to make an attempt to improve diet and exercise patterns to aid in weight loss. -Recommended diet heavy in fruits and veggies and low in animal meats, cheeses, and dairy products, appropriate calorie intake  Patient Care Team: Paseda, Folashade R, FNP as PCP - General (Nurse Practitioner)  HISTORY OF PRESENT ILLNESS: 28 y.o. female with a past medical history listed below presents for evaluation of LLQ ab pain.   Discussed the use of AI scribe software for clinical note transcription with the patient, who gave verbal consent to proceed.  History of Present Illness   Candice Farrell is a 28 year old  female with a history of diverticulitis who presents with chronic abdominal pain and bowel irregularities.  She has experienced chronic abdominal pain and bowel irregularities since 2023, worsening after the birth of her son. Initially described as 'round ligament pain' on the left side, the pain has evolved  into sharp pains radiating to the rectum and back, accompanied by bloating and gas. Nausea occurs more frequently, often in the morning or evening, with episodes of feeling flushed or overheated during constipation or loose stools. She also reports heartburn post-pregnancy, managed by avoiding spicy foods.  She has a history of diverticulitis diagnosed in August or September 2022, three months before her pregnancy, with symptoms of bloody stools and abdominal pain. She cannot recall if a CT scan confirmed the diagnosis. She is allergic to morphine  and fentanyl , which were used for pain management during her previous episode.  Her bowel movements are inconsistent, ranging from constipation to watery stools within a 24-hour period. She experiences urinary frequency without burning, except when there is a tear from frequent urination. Stool color has varied, with episodes of dark, almost black stools in June and July, attributed to muscle relaxer use. Currently, she is experiencing constipation.  She has been on phentermine  for weight loss but has not taken it recently due to concerns about exacerbating her abdominal pain. Increased pain with physical activity has led her to stop exercising. Certain movements, such as bending or sitting cross-legged, exacerbate her pain, which she feels in her abdomen, back, and sacrum.  Her social history includes occasional alcohol use, which increased after her grandmother's death, though she has not been drunk in two to three weeks. She has stopped using marijuana. Her family history is significant for her mother having lupus and her uncle having colon cancer at a young age. Her brother is currently undergoing testing for colon cancer.      She  reports that she has been smoking cigars. She started smoking about 11 years ago. She has never used smokeless tobacco. She reports that she does not currently use alcohol. She reports that she does not currently use drugs after  having used the following drugs: Marijuana.  RELEVANT GI HISTORY, IMAGING AND LABS: Results   LABS WBC: mild elevation (05/27/2024)  RADIOLOGY CT abdomen: fatty liver, mild hepatic enlargement, stool burden (05/27/2024)      CBC    Component Value Date/Time   WBC 10.9 (H) 05/27/2024 2032   RBC 4.11 05/27/2024 2032   HGB 13.0 05/27/2024 2032   HGB 12.7 12/28/2023 1119   HCT 39.7 05/27/2024 2032   HCT 38.7 12/28/2023 1119   PLT 214 05/27/2024 2032   PLT 232 12/28/2023 1119   MCV 96.6 05/27/2024 2032   MCV 93 12/28/2023 1119   MCH 31.6 05/27/2024 2032   MCHC 32.7 05/27/2024 2032   RDW 14.6 05/27/2024 2032   RDW 13.7 12/28/2023 1119   LYMPHSABS 2.5 04/12/2021 1000   MONOABS 0.4 04/12/2021 1000   EOSABS 0.1 04/12/2021 1000   BASOSABS 0.0 04/12/2021 1000   Recent Labs    10/21/23 0940 12/28/23 1119 05/27/24 2032  HGB 12.1 12.7 13.0    CMP     Component Value Date/Time   NA 137 05/27/2024 2032   NA 140 10/21/2023 0940   K 3.8 05/27/2024 2032   CL 102 05/27/2024 2032   CO2 20 (L) 05/27/2024 2032   GLUCOSE 98 05/27/2024 2032   BUN 6 05/27/2024 2032   BUN 10 10/21/2023 0940   CREATININE 0.69  05/27/2024 2032   CALCIUM 9.9 05/27/2024 2032   PROT 7.6 05/27/2024 2032   PROT 6.6 10/21/2023 0940   ALBUMIN 4.6 05/27/2024 2032   ALBUMIN 4.4 10/21/2023 0940   AST 26 05/27/2024 2032   ALT 27 05/27/2024 2032   ALKPHOS 62 05/27/2024 2032   BILITOT 0.4 05/27/2024 2032   BILITOT 0.2 10/21/2023 0940   GFRNONAA >60 05/27/2024 2032   GFRAA >60 08/15/2019 0630      Latest Ref Rng & Units 05/27/2024    8:32 PM 10/21/2023    9:40 AM 03/07/2022    4:52 AM  Hepatic Function  Total Protein 6.5 - 8.1 g/dL 7.6  6.6  6.2   Albumin 3.5 - 5.0 g/dL 4.6  4.4  2.8   AST 15 - 41 U/L 26  21  20    ALT 0 - 44 U/L 27  24  16    Alk Phosphatase 38 - 126 U/L 62  64  91   Total Bilirubin 0.0 - 1.2 mg/dL 0.4  0.2  0.3       Current Medications:      Current Outpatient Medications  (Analgesics):    HYDROcodone -acetaminophen  (NORCO/VICODIN) 5-325 MG tablet, Take 1 tablet by mouth every 4 (four) hours as needed.   Current Outpatient Medications (Other):    hyoscyamine  (LEVSIN ) 0.125 MG tablet, Take 1 tablet (0.125 mg total) by mouth every 6 (six) hours as needed for cramping.   phentermine  15 MG capsule, Take 1 capsule (15 mg total) by mouth every morning. (Patient not taking: Reported on 05/29/2024)  Medical History:  Past Medical History:  Diagnosis Date   Anxiety    Back pain    Back pain without sciatica 10/21/2023   Depression    Gestational diabetes    Hemorrhoids 12/28/2023   PCOS (polycystic ovarian syndrome)    PONV (postoperative nausea and vomiting)    Prediabetes 10/21/2023   Scoliosis    Strain of lumbar region 12/07/2023   Allergies:  Allergies  Allergen Reactions   Fentanyl  Dermatitis and Rash   Morphine  Rash     Surgical History:  She  has a past surgical history that includes Breast cyst excision (Left) and Wisdom tooth extraction. Family History:  Her family history includes Appendicitis in her brother; Breast cancer in her paternal aunt; Colon cancer in her maternal uncle; Deep vein thrombosis in her mother; Diabetes in her mother; Heart disease in her father; Heart failure in her paternal grandmother; Lung cancer in her paternal aunt; Lupus in her mother.  REVIEW OF SYSTEMS  : All other systems reviewed and negative except where noted in the History of Present Illness.  PHYSICAL EXAM: BP 138/78 (BP Location: Left Arm, Patient Position: Sitting, Cuff Size: Large)   Pulse 76   Ht 4' 11 (1.499 m) Comment: height measured without shoes  Wt 188 lb 4 oz (85.4 kg)   LMP 05/02/2024   BMI 38.02 kg/m  Physical Exam   GENERAL APPEARANCE: Well nourished, in no apparent distress. HEENT: No cervical lymphadenopathy, unremarkable thyroid , sclerae anicteric, conjunctiva pink. RESPIRATORY: Respiratory effort normal, breath sounds equal  bilaterally without rales, rhonchi, or wheezing. CARDIO: Regular rate and rhythm with no murmurs, rubs, or gallops, peripheral pulses intact. ABDOMEN: Soft, non-distended, active bowel sounds in all four quadrants, no tenderness to palpation, no rebound, no mass appreciated. RECTAL: Rectal exam performed, no abnormalities noted. MUSCULOSKELETAL: Full range of motion, normal gait, without edema. Hip and back examined, no abnormalities noted. SKIN: Dry, intact without rashes  or lesions. No jaundice. NEURO: Alert, oriented, no focal deficits. PSYCH: Cooperative, normal mood and affect.      Alan JONELLE Coombs, PA-C 2:49 PM

## 2024-05-29 NOTE — Patient Instructions (Addendum)
 Your provider has requested that you go to the basement level for lab work before leaving today. Press B on the elevator. The lab is located at the first door on the left as you exit the elevator.  Due to recent changes in healthcare laws, you may see the results of your imaging and laboratory studies on MyChart before your provider has had a chance to review them.  We understand that in some cases there may be results that are confusing or concerning to you. Not all laboratory results come back in the same time frame and the provider may be waiting for multiple results in order to interpret others.  Please give us  48 hours in order for your provider to thoroughly review all the results before contacting the office for clarification of your results.   Your provider has requested that you have an abdominal x ray before leaving today. Please go to the basement floor to our Radiology department for the test.  Miralax is an osmotic laxative.  It only brings more water  into the stool.  This is safe to take daily.  Can take up to 17 gram of miralax twice a day.  Mix with juice or coffee.  Start 1 capful at night for 3-4 days and reassess your response in 3-4 days.  You can increase and decrease the dose based on your response.  Remember, it can take up to 3-4 days to take effect OR for the effects to wear off.   I often pair this with benefiber in the morning to help assure the stool is not too loose.   CONSIDER LINZESS FOR CONSTIPATION  First do a trial off milk/lactose products if you use them.  Add fiber like benefiber or citracel once a day Increase activity Can do trial of IBGard which is over the counter for AB pain- Take 1-2 capsules once a day for maintence or twice a day during a flare Can send in an anti spasm medication, Levsin , to take as needed.   You have been scheduled for a colonoscopy. Please follow written instructions given to you at your visit today.   If you use inhalers  (even only as needed), please bring them with you on the day of your procedure.  DO NOT TAKE 7 DAYS PRIOR TO TEST- Trulicity (dulaglutide) Ozempic, Wegovy (semaglutide) Mounjaro (tirzepatide) Bydureon Bcise (exanatide extended release)  DO NOT TAKE 1 DAY PRIOR TO YOUR TEST Rybelsus (semaglutide) Adlyxin (lixisenatide) Victoza (liraglutide) Byetta (exanatide) ___________________________________________________________________________   Thank you for trusting me with your gastrointestinal care!   Alan Coombs, PA-C   _______________________________________________________  If your blood pressure at your visit was 140/90 or greater, please contact your primary care physician to follow up on this.  _______________________________________________________  If you are age 24 or older, your body mass index should be between 23-30. Your Body mass index is 38.02 kg/m. If this is out of the aforementioned range listed, please consider follow up with your Primary Care Provider.  If you are age 39 or younger, your body mass index should be between 19-25. Your Body mass index is 38.02 kg/m. If this is out of the aformentioned range listed, please consider follow up with your Primary Care Provider.   ________________________________________________________  The [facility] providers would like to encourage you to use MYCHART to communicate with providers for non-urgent requests or questions.  Due to long hold times on the telephone, sending your provider a message by [facility] may be a faster and more efficient  way to get a response.  Please allow 48 business hours for a response.  Please remember that this is for non-urgent requests.  _______________________________________________________  [facility] is using a team-based approach to care.  Your team is made up of your doctor and two to three APPS. Our APPS (Nurse Practitioners and Physician Assistants) work with your physician  to ensure care continuity for you. They are fully qualified to address your health concerns and develop a treatment plan. They communicate directly with your gastroenterologist to care for you. Seeing the Advanced Practice Practitioners on your physician's team can help you by facilitating care more promptly, often allowing for earlier appointments, access to diagnostic testing, procedures, and other specialty referrals.      FODMAP stands for fermentable oligo-, di-, mono-saccharides and polyols (1). These are the scientific terms used to classify groups of carbs that are difficult for our body to digest and that are notorious for triggering digestive symptoms like bloating, gas, loose stools and stomach pain.   You can try low FODMAP diet  - start with eliminating just one column at a time that you feel may be a trigger for you. - the table at the very bottom contains foods that are low in FODMAPs   Sometimes trying to eliminate the FODMAP's from your diet is difficult or tricky, if you are stuggling with trying to do the elimination diet you can try an enzyme.  There is a food enzymes that you sprinkle in or on your food that helps break down the FODMAP. You can read more about the enzyme by going to this site: https://fodzyme.com/   Here some information about pelvic floor dysfunction. This may be contributing to some of your symptoms. We will continue with our evaluation but I do want you to consider adding on fiber supplement with low-dose MiraLAX daily. We could also refer to pelvic floor physical therapy.   Pelvic Floor Dysfunction, Female Pelvic floor dysfunction (PFD) is a condition that results when the group of muscles and connective tissues that support the organs in the pelvis (pelvic floor muscles) do not work well. These muscles and their connections form a sling that supports the colon and bladder. In women, they also support the uterus. PFD causes pelvic floor muscles to  be too weak, too tight, or both. In PFD, muscle movements are not coordinated. This may cause bowel or bladder problems. It may also cause pain. What are the causes? This condition may be caused by an injury to the pelvic area or by a weakening of pelvic muscles. This often results from pregnancy and childbirth or other types of strain. In many cases, the exact cause is not known. What increases the risk? The following factors may make you more likely to develop this condition: Having chronic bladder tissue inflammation (interstitial cystitis). Being an older person. Being overweight. History of radiation treatment for cancer in the pelvic region. Previous pelvic surgery, such as removal of the uterus (hysterectomy). What are the signs or symptoms? Symptoms of this condition vary and may include: Bladder symptoms, such as: Trouble starting urination and emptying the bladder. Frequent urinary tract infections. Leaking urine when coughing, laughing, or exercising (stress incontinence). Having to pass urine urgently or frequently. Pain when passing urine. Bowel symptoms, such as: Constipation. Urgent or frequent bowel movements. Incomplete bowel movements. Painful bowel movements. Leaking stool or gas. Unexplained genital or rectal pain. Genital or rectal muscle spasms. Low back pain. Other symptoms may include: A heavy, full, or  aching feeling in the vagina. A bulge that protrudes into the vagina. Pain during or after sex. How is this diagnosed? This condition may be diagnosed based on: Your symptoms and medical history. A physical exam. During the exam, your health care provider may check your pelvic muscles for tightness, spasm, pain, or weakness. This may include a rectal exam and a pelvic exam. In some cases, you may have diagnostic tests, such as: Electrical muscle function tests. Urine flow testing. X-ray tests of bowel function. Ultrasound of the pelvic organs. How is  this treated? Treatment for this condition depends on the symptoms. Treatment options include: Physical therapy. This may include Kegel exercises to help relax or strengthen the pelvic floor muscles. Biofeedback. This type of therapy provides feedback on how tight your pelvic floor muscles are so that you can learn to control them. Internal or external massage therapy. A treatment that involves electrical stimulation of the pelvic floor muscles to help control pain (transcutaneous electrical nerve stimulation, or TENS). Sound wave therapy (ultrasound) to reduce muscle spasms. Medicines, such as: Muscle relaxants. Bladder control medicines. Surgery to reconstruct or support pelvic floor muscles may be an option if other treatments do not help. Follow these instructions at home: Activity Do your usual activities as told by your health care provider. Ask your health care provider if you should modify any activities. Do pelvic floor strengthening or relaxing exercises at home as told by your physical therapist. Lifestyle Maintain a healthy weight. Eat foods that are high in fiber, such as beans, whole grains, and fresh fruits and vegetables. Limit foods that are high in fat and processed sugars, such as fried or sweet foods. Manage stress with relaxation techniques such as yoga or meditation. General instructions If you have problems with leakage: Use absorbable pads or wear padded underwear. Wash frequently with mild soap. Keep your genital and anal area as clean and dry as possible. Ask your health care provider if you should try a barrier cream to prevent skin irritation. Take warm baths to relieve pelvic muscle tension or spasms. Take over-the-counter and prescription medicines only as told by your health care provider. Keep all follow-up visits. How is this prevented? The cause of PFD is not always known, but there are a few things you can do to reduce the risk of developing this  condition, including: Staying at a healthy weight. Getting regular exercise. Managing stress. Contact a health care provider if: Your symptoms are not improving with home care. You have signs or symptoms of PFD that get worse at home. You develop new signs or symptoms. You have signs of a urinary tract infection, such as: Fever. Chills. Increased urinary frequency. A burning feeling when urinating. You have not had a bowel movement in 3 days (constipation). Summary Pelvic floor dysfunction results when the muscles and connective tissues in your pelvic floor do not work well. These muscles and their connections form a sling that supports your colon and bladder. In women, they also support the uterus. PFD may be caused by an injury to the pelvic area or by a weakening of pelvic muscles. PFD causes pelvic floor muscles to be too weak, too tight, or a combination of both. Symptoms may vary from person to person. In most cases, PFD can be treated with physical therapies and medicines. Surgery may be an option if other treatments do not help. This information is not intended to replace advice given to you by your health care provider. Make sure you discuss  any questions you have with your health care provider. Document Revised: 01/07/2021 Document Reviewed: 01/07/2021 Elsevier Patient Education  2022 [employer].

## 2024-05-31 ENCOUNTER — Telehealth: Payer: Self-pay | Admitting: Physician Assistant

## 2024-05-31 ENCOUNTER — Ambulatory Visit: Payer: Self-pay | Admitting: Physician Assistant

## 2024-05-31 LAB — TISSUE TRANSGLUTAMINASE, IGA: (tTG) Ab, IgA: <1 U/mL

## 2024-05-31 LAB — IGG: IgG (Immunoglobin G), Serum: 1047 mg/dL (ref 600–1640)

## 2024-05-31 LAB — ANA: Anti Nuclear Antibody (ANA): NEGATIVE

## 2024-05-31 LAB — IGA: Immunoglobulin A: 183 mg/dL (ref 47–310)

## 2024-05-31 LAB — MITOCHONDRIAL ANTIBODIES: Mitochondrial M2 Ab, IgG: <=20 U (ref <=20.0)

## 2024-05-31 LAB — ANTI-SMOOTH MUSCLE ANTIBODY, IGG: Actin (Smooth Muscle) Antibody (IGG): <20 U (ref <20)

## 2024-05-31 LAB — ALPHA-1-ANTITRYPSIN: A-1 Antitrypsin, Ser: 144 mg/dL (ref 83–199)

## 2024-05-31 LAB — CERULOPLASMIN: Ceruloplasmin: 27 mg/dL (ref 14–48)

## 2024-05-31 NOTE — Telephone Encounter (Signed)
 Received a call from patient stating she was advised to purchase SUPREP via prep procedure appointment. She is requesting that the suprep be provided through her insurance because of costliness. Please review and advise  Thank you

## 2024-06-01 ENCOUNTER — Ambulatory Visit: Payer: MEDICAID | Admitting: Physician Assistant

## 2024-06-01 NOTE — Telephone Encounter (Signed)
 I spoke to [facility] and she advised that she misunderstood what the pharmacy had for her to pick up.  She said that they gave her the generic name instead of Suprep and she misunderstood.

## 2024-06-02 LAB — COMPREHENSIVE METABOLIC PANEL WITH GFR
ALT: 26 U/L (ref 0–35)
AST: 27 U/L (ref 0–37)
Albumin: 4.7 g/dL (ref 3.5–5.2)
Alkaline Phosphatase: 46 U/L (ref 39–117)
BUN: 11 mg/dL (ref 6–23)
CO2: 26 meq/L (ref 19–32)
Calcium: 9.2 mg/dL (ref 8.4–10.5)
Chloride: 105 meq/L (ref 96–112)
Creatinine, Ser: 0.83 mg/dL (ref 0.40–1.20)
GFR: 97.07 mL/min (ref >60.00)
Glucose, Bld: 87 mg/dL (ref 70–99)
Potassium: 3.9 meq/L (ref 3.5–5.1)
Sodium: 136 meq/L (ref 135–145)
Total Bilirubin: 0.4 mg/dL (ref 0.2–1.2)
Total Protein: 7.4 g/dL (ref 6.0–8.3)

## 2024-06-05 ENCOUNTER — Encounter: Payer: Self-pay | Admitting: Gastroenterology

## 2024-06-05 ENCOUNTER — Ambulatory Visit: Payer: MEDICAID | Admitting: Gastroenterology

## 2024-06-05 VITALS — BP 113/59 | HR 78 | Temp 98.2°F | Resp 12 | Ht 59.0 in | Wt 188.0 lb

## 2024-06-05 DIAGNOSIS — R1032 Left lower quadrant pain: Secondary | ICD-10-CM

## 2024-06-05 DIAGNOSIS — D122 Benign neoplasm of ascending colon: Secondary | ICD-10-CM

## 2024-06-05 DIAGNOSIS — K640 First degree hemorrhoids: Secondary | ICD-10-CM

## 2024-06-05 DIAGNOSIS — K648 Other hemorrhoids: Secondary | ICD-10-CM | POA: Diagnosis not present

## 2024-06-05 DIAGNOSIS — R194 Change in bowel habit: Secondary | ICD-10-CM

## 2024-06-05 DIAGNOSIS — K6289 Other specified diseases of anus and rectum: Secondary | ICD-10-CM

## 2024-06-05 DIAGNOSIS — R198 Other specified symptoms and signs involving the digestive system and abdomen: Secondary | ICD-10-CM

## 2024-06-05 MED ORDER — SODIUM CHLORIDE 0.9 % IV SOLN: 500.0000 mL | Freq: Once | INTRAVENOUS | Status: DC

## 2024-06-05 NOTE — Patient Instructions (Signed)
 Thank you for letting us  care for your healthcare needs today! Please see handouts regarding Polyps and Hemorrhoids. Resume previous diet and medications. Await pathology results. Use fiber- for example, Citrucel, Fibercon, Konsyl, or Metamucil. Return to GI Clinic PRN.  YOU HAD AN ENDOSCOPIC PROCEDURE TODAY AT THE [facility]:   Refer to the procedure report that was given to you for any specific questions about what was found during the examination.  If the procedure report does not answer your questions, please call your gastroenterologist to clarify.  If you requested that your care partner not be given the details of your procedure findings, then the procedure report has been included in a sealed envelope for you to review at your convenience later.  YOU SHOULD EXPECT: Some feelings of bloating in the abdomen. Passage of more gas than usual.  Walking can help get rid of the air that was put into your GI tract during the procedure and reduce the bloating. If you had a lower endoscopy (such as a colonoscopy or flexible sigmoidoscopy) you may notice spotting of blood in your stool or on the toilet paper. If you underwent a bowel prep for your procedure, you may not have a normal bowel movement for a few days.  Please Note:  You might notice some irritation and congestion in your nose or some drainage.  This is from the oxygen used during your procedure.  There is no need for concern and it should clear up in a day or so.  SYMPTOMS TO REPORT IMMEDIATELY:  Following lower endoscopy (colonoscopy or flexible sigmoidoscopy):  Excessive amounts of blood in the stool  Significant tenderness or worsening of abdominal pains  Swelling of the abdomen that is new, acute  Fever of 100F or higher  For urgent or emergent issues, a gastroenterologist can be reached at any hour by calling [phone]. Do not use MyChart messaging for urgent concerns.    DIET:  We do recommend a small meal at  first, but then you may proceed to your regular diet.  Drink plenty of fluids but you should avoid alcoholic beverages for 24 hours.  ACTIVITY:  You should plan to take it easy for the rest of today and you should NOT DRIVE or use heavy machinery until tomorrow (because of the sedation medicines used during the test).    FOLLOW UP: Our staff will call the number listed on your records the next business day following your procedure.  We will call around 7:15- 8:00 am to check on you and address any questions or concerns that you may have regarding the information given to you following your procedure. If we do not reach you, we will leave a message.     If any biopsies were taken you will be contacted by phone or by letter within the next 1-3 weeks.  Please call us  at [phone] if you have not heard about the biopsies in 3 weeks.    SIGNATURES/CONFIDENTIALITY: You and/or your care partner have signed paperwork which will be entered into your electronic medical record.  These signatures attest to the fact that that the information above on your After Visit Summary has been reviewed and is understood.  Full responsibility of the confidentiality of this discharge information lies with you and/or your care-partner.

## 2024-06-05 NOTE — Op Note (Signed)
 [facility] Patient Name: Candice Farrell Procedure Date: 06/05/2024 8:29 AM MRN: [id number] Endoscopist: Sandor Flatter , MD, [id number] Age: 28 Referring MD:  Date of Birth: 1996-05-09 Gender: Female Account #: [id number] Procedure:                Colonoscopy Indications:              Abdominal pain in the left lower quadrant,                            Hematochezia, Change in bowel habits Medicines:                Monitored Anesthesia Care Procedure:                Pre-Anesthesia Assessment:                           - Prior to the procedure, a History and Physical                            was performed, and patient medications and                            allergies were reviewed. The patient's tolerance of                            previous anesthesia was also reviewed. The risks                            and benefits of the procedure and the sedation                            options and risks were discussed with the patient.                            All questions were answered, and informed consent                            was obtained. Prior Anticoagulants: The patient has                            taken no anticoagulant or antiplatelet agents. ASA                            Grade Assessment: II - A patient with mild systemic                            disease. After reviewing the risks and benefits,                            the patient was deemed in satisfactory condition to                            undergo the procedure.  After obtaining informed consent, the colonoscope                            was passed under direct vision. Throughout the                            procedure, the patient's blood pressure, pulse, and                            oxygen saturations were monitored continuously. The                            CF [id number] #[id number] was introduced through the anus                            and advanced to the the  cecum, identified by                            appendiceal orifice and ileocecal valve. The                            colonoscopy was performed without difficulty. The                            patient tolerated the procedure well. The quality                            of the bowel preparation was good. The ileocecal                            valve, appendiceal orifice, and rectum were                            photographed. Scope In: 8:48:13 AM Scope Out: 9:07:07 AM Scope Withdrawal Time: 0 hours 16 minutes 42 seconds  Total Procedure Duration: 0 hours 18 minutes 54 seconds  Findings:                 The perianal and digital rectal examinations were                            normal.                           A 4 mm polyp was found in the ascending colon. The                            polyp was sessile. The polyp was removed with a                            cold snare. Resection and retrieval were complete.                            Estimated blood loss was minimal.  Normal mucosa was found in the entire colon.                            Biopsies for histology were taken with a cold                            forceps from the right colon and left colon for                            evaluation of microscopic colitis. Estimated blood                            loss was minimal.                           Retroflexion in the rectum was not performed due to                            anatomy. Anterograde views were otherwise normal.                           Non-bleeding internal hemorrhoids were found. The                            hemorrhoids were small. Complications:            No immediate complications. Estimated Blood Loss:     Estimated blood loss was minimal. Impression:               - One 4 mm polyp in the ascending colon, removed                            with a cold snare. Resected and retrieved.                           - Normal mucosa in  the entire examined colon.                            Biopsied.                           - Non-bleeding internal hemorrhoids. Recommendation:           - Patient has a contact number available for                            emergencies. The signs and symptoms of potential                            delayed complications were discussed with the                            patient. Return to normal activities tomorrow.                            Written discharge instructions  were provided to the                            patient.                           - Resume previous diet.                           - Continue present medications.                           - Await pathology results.                           - Repeat colonoscopy for surveillance based on                            pathology results.                           - Return to GI clinic PRN.                           - Use fiber, for example Citrucel, Fibercon, Konsyl                            or Metamucil. Sandor Flatter, MD 06/05/2024 9:13:05 AM

## 2024-06-05 NOTE — Progress Notes (Signed)
 Agree with the assessment and plan as outlined by Quentin Mulling, PA-C. ? ?Keron Neenan, DO, FACG ? ?

## 2024-06-05 NOTE — Progress Notes (Signed)
 Report to PACU, RN, vss, BBS= Clear.

## 2024-06-05 NOTE — Progress Notes (Signed)
 GASTROENTEROLOGY PROCEDURE H&P NOTE   Primary Care Physician: Paseda, Folashade R, FNP    Reason for Procedure:  LLQ pain, change in bowel habits, abdominal bloating, history of diverticulitis  Plan:    Colonoscopy  Patient is appropriate for endoscopic procedure(s) in the ambulatory ([facility]) setting.  The nature of the procedure, as well as the risks, benefits, and alternatives were carefully and thoroughly reviewed with the patient. Ample time for discussion and questions allowed. The patient understood, was satisfied, and agreed to proceed.     HPI: Candice Farrell is a 28 y.o. female who presents for colonoscopy for evaluation of LLQ pain, change in bowel habits, abdominal bloating, prior history of diverticulitis.  Patient was most recently seen in the Gastroenterology Clinic on 05/29/2024.  No interval change in medical history since that appointment. Please refer to that note for full details regarding GI history and clinical presentation.   Past Medical History:  Diagnosis Date   Anxiety    Back pain    Back pain without sciatica 10/21/2023   Depression    Gestational diabetes    Hemorrhoids 12/28/2023   PCOS (polycystic ovarian syndrome)    PONV (postoperative nausea and vomiting)    Prediabetes 10/21/2023   Scoliosis    Strain of lumbar region 12/07/2023    Past Surgical History:  Procedure Laterality Date   BREAST CYST EXCISION Left    WISDOM TOOTH EXTRACTION      Prior to Admission medications   Medication Sig Start Date End Date Taking? Authorizing Provider  HYDROcodone -acetaminophen  (NORCO/VICODIN) 5-325 MG tablet Take 1 tablet by mouth every 4 (four) hours as needed. 05/28/24   Raford Lenis, MD  hyoscyamine  (LEVSIN ) 0.125 MG tablet Take 1 tablet (0.125 mg total) by mouth every 6 (six) hours as needed for cramping. Patient not taking: Reported on 06/05/2024 05/29/24   Craig Alan SAUNDERS, PA-C  phentermine  15 MG capsule Take 1 capsule (15 mg total) by mouth every  morning. Patient not taking: Reported on 05/29/2024 05/08/24   Paseda, Folashade R, FNP    Current Outpatient Medications  Medication Sig Dispense Refill   HYDROcodone -acetaminophen  (NORCO/VICODIN) 5-325 MG tablet Take 1 tablet by mouth every 4 (four) hours as needed. 10 tablet 0   hyoscyamine  (LEVSIN ) 0.125 MG tablet Take 1 tablet (0.125 mg total) by mouth every 6 (six) hours as needed for cramping. (Patient not taking: Reported on 06/05/2024) 30 tablet 0   phentermine  15 MG capsule Take 1 capsule (15 mg total) by mouth every morning. (Patient not taking: Reported on 05/29/2024) 30 capsule 0   Current Facility-Administered Medications  Medication Dose Route Frequency Provider Last Rate Last Admin   0.9 %  sodium chloride  infusion  500 mL Intravenous Once Zaki Gertsch V, DO        Allergies as of 06/05/2024 - Review Complete 05/29/2024  Allergen Reaction Noted   Fentanyl  Dermatitis and Rash 05/28/2024   Morphine  Rash 05/29/2024    Family History  Problem Relation Age of Onset   Diabetes Mother    Deep vein thrombosis Mother    Lupus Mother    Heart disease Father    Appendicitis Brother    Colon cancer Maternal Uncle    Breast cancer Paternal Aunt    Lung cancer Paternal Aunt    Heart failure Paternal Grandmother    Ulcerative colitis Neg Hx    Stomach cancer Neg Hx    Esophageal cancer Neg Hx     Social History   Socioeconomic History  Marital status: Married    Spouse name: Not on file   Number of children: 2   Years of education: Not on file   Highest education level: Some college, no degree  Occupational History   Not on file  Tobacco Use   Smoking status: Some Days    Types: Cigars    Start date: 2014   Smokeless tobacco: Never   Tobacco comments:    Black and milds  Vaping Use   Vaping status: Never Used  Substance and Sexual Activity   Alcohol use: Not Currently    Comment: occ during holidays   Drug use: Not Currently    Types: Marijuana     Comment: at age 73, none since   Sexual activity: Yes    Birth control/protection: None  Other Topics Concern   Not on file  Social History Narrative   Lives with her husband    Social Drivers of Health   Financial Resource Strain: Low Risk  (04/06/2024)   Overall Financial Resource Strain (CARDIA)    Difficulty of Paying Living Expenses: Not hard at all  Food Insecurity: No Food Insecurity (04/06/2024)   Hunger Vital Sign    Worried About Running Out of Food in the Last Year: Never true    Ran Out of Food in the Last Year: Never true  Transportation Needs: No Transportation Needs (04/06/2024)   PRAPARE - Administrator, Civil Service (Medical): No    Lack of Transportation (Non-Medical): No  Physical Activity: Insufficiently Active (04/06/2024)   Exercise Vital Sign    Days of Exercise per Week: 3 days    Minutes of Exercise per Session: 30 min  Stress: No Stress Concern Present (04/06/2024)   [employer] of Occupational Health - Occupational Stress Questionnaire    Feeling of Stress: Not at all  Social Connections: Moderately Integrated (04/06/2024)   Social Connection and Isolation Panel    Frequency of Communication with Friends and Family: Once a week    Frequency of Social Gatherings with Friends and Family: Never    Attends Religious Services: More than 4 times per year    Active Member of [employer] or Organizations: Yes    Attends Banker Meetings: Never    Marital Status: Living with partner  Intimate Partner Violence: Not on file    Physical Exam: Vital signs in last 24 hours: @BP  (!) 144/96   Pulse 70   Temp 98.2 F (36.8 C)   Ht 4' 11 (1.499 m)   Wt 188 lb (85.3 kg)   LMP 05/02/2024   SpO2 100%   BMI 37.97 kg/m  GEN: NAD EYE: Sclerae anicteric ENT: MMM CV: Non-tachycardic Pulm: CTA b/l GI: Soft, NT/ND NEURO:  Alert & Oriented x 3   Sandor Flatter, DO [facility]   06/05/2024 8:37 AM

## 2024-06-05 NOTE — Progress Notes (Signed)
 Called to room to assist during endoscopic procedure.  Patient ID and intended procedure confirmed with present staff. Received instructions for my participation in the procedure from the performing physician.

## 2024-06-05 NOTE — Progress Notes (Signed)
 Pt's states no medical or surgical changes since previsit or office visit.

## 2024-06-06 ENCOUNTER — Telehealth: Payer: Self-pay | Admitting: *Deleted

## 2024-06-06 NOTE — Telephone Encounter (Signed)
  Follow up Call-     06/05/2024    8:26 AM  Call back number  Post procedure Call Back phone  # [phone]  Permission to leave phone message Yes     Patient questions:  Do you have a fever, pain , or abdominal swelling? Yes.   Pain Score  7 * Pt reports pain/tenderness in her left lower abdomen 7/10 this morning. States she is unable to lay on that side. She has been able to pass some gas and had a BM this morning. States the pain feels similar to the pain she was having prior to the procedure. She also reports feeling bloated. Encouraged pt to ambulate today and instructed that she could try OTC GasX for the bloating. Also noted that pt had Levsin  on her med list. She has not yet picked this up from the pharmacy to try. Instructed her that she could also try the Levsin  for the abdominal discomfort and bloating. Will make MD aware and if any further recommendations will call pt back.   Have you tolerated food without any problems? Yes.    Have you been able to return to your normal activities? Yes.    Do you have any questions about your discharge instructions: Diet   No. Medications  No. Follow up visit  No.  Do you have questions or concerns about your Care? No.  Actions: * If pain score is 4 or above: Physician/ provider Notified : Vito Cirigliano, DO. Date 06/06/2024  at Time 7:29 AM .

## 2024-06-07 LAB — SURGICAL PATHOLOGY

## 2024-06-08 ENCOUNTER — Other Ambulatory Visit: Payer: MEDICAID

## 2024-06-08 ENCOUNTER — Ambulatory Visit: Payer: Self-pay | Admitting: Gastroenterology

## 2024-06-08 DIAGNOSIS — R198 Other specified symptoms and signs involving the digestive system and abdomen: Secondary | ICD-10-CM

## 2024-06-08 DIAGNOSIS — K760 Fatty (change of) liver, not elsewhere classified: Secondary | ICD-10-CM

## 2024-06-08 DIAGNOSIS — K6289 Other specified diseases of anus and rectum: Secondary | ICD-10-CM

## 2024-06-08 DIAGNOSIS — R1032 Left lower quadrant pain: Secondary | ICD-10-CM

## 2024-06-11 LAB — HELICOBACTER PYLORI  SPECIAL ANTIGEN
MICRO NUMBER:: 17022874
SPECIMEN QUALITY: ADEQUATE

## 2024-07-31 ENCOUNTER — Encounter: Payer: Self-pay | Admitting: Nurse Practitioner

## 2024-07-31 ENCOUNTER — Ambulatory Visit: Payer: MEDICAID | Admitting: Physician Assistant

## 2024-07-31 NOTE — Progress Notes (Deleted)
 07/31/2024 Candice Farrell [id number] Jun 06, 1996  Referring provider: Paseda, Folashade R, FNP Primary GI doctor: Dr. San  ASSESSMENT AND PLAN:  LLQ pain x 2022 with pains into her rectum and back Has increasing bloating, alternating constipation/diarrhea, nausea without vomiting, sweating/flushed with previous history of diverticulitis however I am unable to find imaging, states was in 2022 but not on CT 08/26/2022 CTAP W for abdominal pain moderate colonic stool burden 05/28/2024 CTAP W moderate hepatic steatosis and mild hepatomegaly gallbladder unremarkable unremarkable stomach and bowel. 05/31/2024 sed rate, CRP, celiac, H. pylori negative 06/05/2024 colonoscopyGood prep 4 mm TA polyp in ascending colon normal mucosa nonbleeding internal hemorrhoid, negative colitis.  Recall 5 years Possible constipation/IBS, pelvic floor, rule out celiac, IBD, malignancy, MSK from hip - consider pelvic floor PT, information given -Can do trial of IBGARD daily, will give Levsin  -FODMAP,  and lifestyle changes discussed - salon pas patches for possible MSK,check hip xray  Anorectal pain/back pain associated with the LLQ pain may be caused by hemorrhoids, rectocele/pelvic floor/constipation/MSK from back or hip, pelvic inflammatory disease, and coccydynia.  -Recommend follow up with GYN -  Xray of hip for pain negative  Hepatic steatosis and hepatomegaly  seen on CTAP W 05/28/2024 Remote mild elevation of LFTs 08/2019 AST 43 ALT 63 normal alk phos T. Bili 10/21/2023 acute hepatitis panel negative Family history of liver issues  Negative serological workup 05/31/2024    Latest Ref Rng & Units 05/29/2024    3:53 PM 05/27/2024    8:32 PM 10/21/2023    9:40 AM  Hepatic Function  Total Protein 6.0 - 8.3 g/dL 7.4  7.6  6.6   Albumin 3.5 - 5.2 g/dL 4.7  4.6  4.4   AST 0 - 37 U/L 27  26  21    ALT 0 - 35 U/L 26  27  24    Alk Phosphatase 39 - 117 U/L 46  62  64   Total Bilirubin 0.2 - 1.2 mg/dL  0.4  0.4  0.2    Platelets 214  - consider fibrosure - need LFTs and CBC monitored every 6 months, - revaluation with imaging every 2-3 years.  -Continue to work on risk factor modification including diet exercise and control of risk factors including blood sugars.  Morbid obesity  There is no height or weight on file to calculate BMI.  -Patient has been advised to make an attempt to improve diet and exercise patterns to aid in weight loss. -Recommended diet heavy in fruits and veggies and low in animal meats, cheeses, and dairy products, appropriate calorie intake  Personal history of polyps 06/05/2024 colonoscopyGood prep 4 mm TA polyp in ascending colon normal mucosa nonbleeding internal hemorrhoid, negative colitis.  Recall 5 years  Patient Care Team: Paseda, Folashade R, FNP as PCP - General (Nurse Practitioner)  HISTORY OF PRESENT ILLNESS: 28 y.o. female with a past medical history listed below presents for evaluation of LLQ ab pain.   I last saw the patient in the office 05/29/2024 for multitude of GI symptoms.  Discussed the use of AI scribe software for clinical note transcription with the patient, who gave verbal consent to proceed.  History of Present Illness          She  reports that she has been smoking cigars. She started smoking about 11 years ago. She has never used smokeless tobacco. She reports that she does not currently use alcohol. She reports that she does not currently use drugs after having used  the following drugs: Marijuana.  RELEVANT GI HISTORY, IMAGING AND LABS: Results          CBC    Component Value Date/Time   WBC 8.6 05/29/2024 1553   RBC 4.01 05/29/2024 1553   HGB 12.7 05/29/2024 1553   HGB 12.7 12/28/2023 1119   HCT 38.4 05/29/2024 1553   HCT 38.7 12/28/2023 1119   PLT 212.0 05/29/2024 1553   PLT 232 12/28/2023 1119   MCV 95.8 05/29/2024 1553   MCV 93 12/28/2023 1119   MCH 31.6 05/27/2024 2032   MCHC 33.1 05/29/2024 1553   RDW 15.7  (H) 05/29/2024 1553   RDW 13.7 12/28/2023 1119   LYMPHSABS 2.6 05/29/2024 1553   MONOABS 0.5 05/29/2024 1553   EOSABS 0.1 05/29/2024 1553   BASOSABS 0.0 05/29/2024 1553   Recent Labs    10/21/23 0940 12/28/23 1119 05/27/24 2032 05/29/24 1553  HGB 12.1 12.7 13.0 12.7    CMP     Component Value Date/Time   NA 136 05/29/2024 1553   NA 140 10/21/2023 0940   K 3.9 05/29/2024 1553   CL 105 05/29/2024 1553   CO2 26 05/29/2024 1553   GLUCOSE 87 05/29/2024 1553   BUN 11 05/29/2024 1553   BUN 10 10/21/2023 0940   CREATININE 0.83 05/29/2024 1553   CALCIUM 9.2 05/29/2024 1553   PROT 7.4 05/29/2024 1553   PROT 6.6 10/21/2023 0940   ALBUMIN 4.7 05/29/2024 1553   ALBUMIN 4.4 10/21/2023 0940   AST 27 05/29/2024 1553   ALT 26 05/29/2024 1553   ALKPHOS 46 05/29/2024 1553   BILITOT 0.4 05/29/2024 1553   BILITOT 0.2 10/21/2023 0940   GFRNONAA >60 05/27/2024 2032   GFRAA >60 08/15/2019 0630      Latest Ref Rng & Units 05/29/2024    3:53 PM 05/27/2024    8:32 PM 10/21/2023    9:40 AM  Hepatic Function  Total Protein 6.0 - 8.3 g/dL 7.4  7.6  6.6   Albumin 3.5 - 5.2 g/dL 4.7  4.6  4.4   AST 0 - 37 U/L 27  26  21    ALT 0 - 35 U/L 26  27  24    Alk Phosphatase 39 - 117 U/L 46  62  64   Total Bilirubin 0.2 - 1.2 mg/dL 0.4  0.4  0.2       Current Medications:      Current Outpatient Medications (Analgesics):    HYDROcodone -acetaminophen  (NORCO/VICODIN) 5-325 MG tablet, Take 1 tablet by mouth every 4 (four) hours as needed.   Current Outpatient Medications (Other):    hyoscyamine  (LEVSIN ) 0.125 MG tablet, Take 1 tablet (0.125 mg total) by mouth every 6 (six) hours as needed for cramping. (Patient not taking: Reported on 06/05/2024)   phentermine  15 MG capsule, Take 1 capsule (15 mg total) by mouth every morning. (Patient not taking: Reported on 05/29/2024)  Medical History:  Past Medical History:  Diagnosis Date   Anxiety    Back pain    Back pain without sciatica 10/21/2023    Depression    Gestational diabetes    Hemorrhoids 12/28/2023   PCOS (polycystic ovarian syndrome)    PONV (postoperative nausea and vomiting)    Prediabetes 10/21/2023   Scoliosis    Strain of lumbar region 12/07/2023   Allergies:  Allergies  Allergen Reactions   Fentanyl  Dermatitis and Rash   Morphine  Rash     Surgical History:  She  has a past surgical history that includes Breast  cyst excision (Left) and Wisdom tooth extraction. Family History:  Her family history includes Appendicitis in her brother; Breast cancer in her paternal aunt; Colon cancer in her maternal uncle; Deep vein thrombosis in her mother; Diabetes in her mother; Heart disease in her father; Heart failure in her paternal grandmother; Lung cancer in her paternal aunt; Lupus in her mother.  REVIEW OF SYSTEMS  : All other systems reviewed and negative except where noted in the History of Present Illness.  PHYSICAL EXAM: There were no vitals taken for this visit. Physical Exam          Alan JONELLE Coombs, PA-C 7:50 AM

## 2024-08-01 ENCOUNTER — Encounter: Payer: MEDICAID | Admitting: Nurse Practitioner

## 2024-09-15 ENCOUNTER — Emergency Department (HOSPITAL_COMMUNITY)
Admission: EM | Admit: 2024-09-15 | Discharge: 2024-09-15 | Discharge status: Against Medical Advice | Payer: MEDICAID | Attending: Emergency Medicine | Admitting: Emergency Medicine

## 2024-09-15 ENCOUNTER — Encounter (HOSPITAL_COMMUNITY): Payer: Self-pay | Admitting: Pharmacy Technician

## 2024-09-15 ENCOUNTER — Emergency Department (HOSPITAL_COMMUNITY): Payer: MEDICAID

## 2024-09-15 ENCOUNTER — Other Ambulatory Visit: Payer: Self-pay

## 2024-09-15 DIAGNOSIS — Z5321 Procedure and treatment not carried out due to patient leaving prior to being seen by health care provider: Secondary | ICD-10-CM | POA: Diagnosis not present

## 2024-09-15 DIAGNOSIS — R002 Palpitations: Secondary | ICD-10-CM | POA: Insufficient documentation

## 2024-09-15 DIAGNOSIS — R0789 Other chest pain: Secondary | ICD-10-CM | POA: Insufficient documentation

## 2024-09-15 DIAGNOSIS — R1110 Vomiting, unspecified: Secondary | ICD-10-CM | POA: Diagnosis not present

## 2024-09-15 LAB — CBC
HCT: 43.1 % (ref 36.0–46.0)
Hemoglobin: 13.6 g/dL (ref 12.0–15.0)
MCH: 31.1 pg (ref 26.0–34.0)
MCHC: 31.6 g/dL (ref 30.0–36.0)
MCV: 98.4 fL (ref 80.0–100.0)
Platelets: 187 K/uL (ref 150–400)
RBC: 4.38 MIL/uL (ref 3.87–5.11)
RDW: 14.4 % (ref 11.5–15.5)
WBC: 8.6 K/uL (ref 4.0–10.5)
nRBC: 0 % (ref 0.0–0.2)

## 2024-09-15 LAB — BASIC METABOLIC PANEL WITH GFR
Anion gap: 9 (ref 5–15)
BUN: 10 mg/dL (ref 6–20)
CO2: 26 mmol/L (ref 22–32)
Calcium: 9.7 mg/dL (ref 8.9–10.3)
Chloride: 105 mmol/L (ref 98–111)
Creatinine, Ser: 0.82 mg/dL (ref 0.44–1.00)
GFR, Estimated: >60 mL/min
Glucose, Bld: 104 mg/dL — ABNORMAL HIGH (ref 70–99)
Potassium: 4.1 mmol/L (ref 3.5–5.1)
Sodium: 139 mmol/L (ref 135–145)

## 2024-09-15 LAB — TROPONIN T, HIGH SENSITIVITY: Troponin T High Sensitivity: <15 ng/L (ref 0–19)

## 2024-09-15 NOTE — ED Notes (Signed)
 Pt refusing to go back to lobby. Demanding to be seen now. Pt informed we would be able to get repeat troponin and covid swab, repeat vitals. Pt states this is unacceptable and requesting to sign AMA form. Pt then asking for her results. Informed pt she could access mychart.

## 2024-09-15 NOTE — ED Notes (Signed)
 Pt requesting to speak to writer due to wait, upon entering lobby she is outside getting fresh air pt is pushed back in by tech talking on phone. She ends call then proceeds to throw head back, put hand on chest and describe she is having chest pain. Writer tries to talk with her, she states she will sign AMA. After brief community education officer took pt to triage room 2 for reassessment by triage nurse. At this time due to emergent situation, writer left room.

## 2024-09-15 NOTE — ED Notes (Signed)
 Writer in lobby , patient grabbed writers arm and stated she was hurting. Writer had triage tech obtain vital signs of patient. VS WNL

## 2024-09-15 NOTE — ED Triage Notes (Signed)
 Pt here POV with reports of blood in stool onset today. Endorses chest tightness and palpitations. Reports emesis X1 today. States unable to tolerate PO without making her stomach feel like she ate too much.  Nurse at job noted BP to be 168/89. Pt endorses fatigue over the last week along with headache.    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

## 2024-09-15 NOTE — ED Notes (Signed)
Patient called for lab work. No answer

## 2024-09-15 NOTE — ED Notes (Addendum)
Patient not visualized in lobby.

## 2024-09-18 NOTE — Progress Notes (Signed)
 " Subjective Patient ID: Candice Farrell is a 29 y.o. female.  Chief Complaint  Patient presents with   Follow-up    Left ER due to wait on Saturday and came here to be evaluated, treated for diverticulitis, CT scan on Oct 08, 2024 and awaiting PCP appt, Candice Farrell is having rectal bleeding,chest pain, low back pain and abdominal pain, gets nausea with antibiotics been vomiting but all symptoms have come back, needs work notes, is wondering if Candice Farrell can get CT scan sooner?    The following information was reviewed by members of the visit team:  Tobacco  Allergies  Meds  Problems  Med Hx  Surg Hx  OB Status   Fam Hx  Soc Hx     Pt is a 29 yo here for ongoing LLQ pain, has been seen by GI and had colonoscopy; states has had bright red blood in stool since Saturday; went to ER and had labs, reviewed, WNL; Candice Farrell was then seen here due to wait time at ER and started on augmentin for presumed diverticulitis; Candice Farrell states Candice Farrell started having pain today and vomiting - reports Candice Farrell has abdominal pain and vomiting with antibiotics. Candice Farrell states Candice Farrell started having bright red blood with Bms again, hx of hemorrhoids. Candice Farrell states Candice Farrell was also seen by GYN for pelvic pain and diagnosed with PCOS. Candice Farrell states Candice Farrell has had US  however has not had laparatomy. Candice Farrell  has CT scheduled later this month. Candice Farrell has had several Cts in the past which have been largely unremarkable. Candice Farrell denies fever or diarrhea ; Candice Farrell states Candice Farrell is here for work note extension given her pain restarted this am and had N/V does not feel Candice Farrell can return today. Candice Farrell is stable on exam    Review of Systems  Gastrointestinal:  Positive for abdominal pain, blood in stool, nausea and vomiting.  All other systems reviewed and are negative.   Objective Physical Exam Vitals and nursing note reviewed.  Constitutional:      General: Candice Farrell is not in acute distress.    Appearance: Candice Farrell is obese.  HENT:     Head: Normocephalic.     Nose: Nose normal.      Mouth/Throat:     Mouth: Mucous membranes are moist.  Eyes:     Extraocular Movements: Extraocular movements intact.     Pupils: Pupils are equal, round, and reactive to light.  Abdominal:     Tenderness: There is abdominal tenderness in the left lower quadrant.  Skin:    General: Skin is warm.     Capillary Refill: Capillary refill takes less than 2 seconds.  Neurological:     General: No focal deficit present.     Mental Status: Candice Farrell is alert and oriented to person, place, and time.  Psychiatric:        Mood and Affect: Mood normal.        Behavior: Behavior normal.     Assessment/Plan Diagnoses and all orders for this visit:  Rectal bleeding -     metoclopramide  (REGLAN ) 5 mg tablet; Take 1 tablet (5 mg total) by mouth 4 (four) times a day for 10 days. -     predniSONE  (DELTASONE ) 20 mg tablet; Take 2 tablets (40 mg total) by mouth daily for 5 days.  Left lower quadrant abdominal pain -     metoclopramide  (REGLAN ) 5 mg tablet; Take 1 tablet (5 mg total) by mouth 4 (four) times a day for 10 days. -  predniSONE  (DELTASONE ) 20 mg tablet; Take 2 tablets (40 mg total) by mouth daily for 5 days.  PCOS (polycystic ovarian syndrome) -     Ambulatory referral to Obstetrics / Gynecology; Future    DDX: cholecystitis, cholelithiasis, gastritis, PUD, appendicitis, gastritis, pancreatitis, SBO, mesenteric adenitis, bowel perforation, diverticulosis, diverticulitis,   MDM: Pt here for ongoing abdominal/pelvic pain; recc Candice Farrell return to ER for stat imaging, pt states Candice Farrell will go there today POV. Risks/benefits discussed. Also recc Candice Farrell circle back with GYN as her pain could be related to PCOS. Exploratory laparotomy may be helpful in diagnosis of ongoing pain since her colonoscopy and work up with GI appears to be largely WNL. Candice Farrell would like second opinion on this, referral sent internally. Discussed symptomatic treatment as well as follow up, advised to see PCP in 3-4 days for reassessment,   advised on red flags warranting ER evaluation, pt verbalized understanding of all instructions, agreeable to plan of care, stable at departure.  Urgent Care Disposition:  Routine Follow Up with Specialist    Electronically signed: Rexene Charlies Pouch, NP 09/18/2024  9:00 AM   "

## 2024-09-19 NOTE — Progress Notes (Signed)
 "    Name: Candice Farrell Date of visit: 09/19/2024  Chief Complaint   Chief Complaint  Patient presents with   Establish Care   Follow-up    Urgent care/ED: LLQ pain radiating to Left back. Any type of movements aggravates the pain.     Subjective  Candice Farrell is a 29 y.o. female who presents today at [facility] to establish care with new provider.   History of Present Illness The patient is a 29 year old female who presents to establish care and f/u from UC for LLQ pain.  PMHx including: MDD, GAD, Bipolar 2, ADHD, Obesity, tobacco use, PCOS, and LLQ pain with history of hemorrhoids and diverticulitis. She was most recently followed by [facility] and had extensive GI workup for IBS/Chron's/etc, as well as a colonoscopy in 05/2024. All with reassuring results.  She presented to the ED and UC on 09/15/2024 for rectal bleeding and LLQ pain (left ED AMA due to long wait times), she was given medication for pain, inflammation and possible diverticulitis and ordered new CT abdomen/pelvis at [facility] which is scheduled for later this month. She was seen again for same condition yesterday at UC, and given PO prednisone  and metoclopramide  and referrals to OB-GYN and GI placed to investigate abdominal pain further. She has not picked up prescriptions and is unsure if she should take oral prednisone  due to being given IM steroid dose on 09/15/2024.   Marital Status: Unmarried but in a relationship. Education Level: Some college experience. Occupation: CNA. Diet: Attempting a high-fiber diet, takes Fiber One in the morning. Alcohol: Occasional use. Tobacco: Smokes cigarettes. Recreational Drugs: No drug use, marijuana use or gummies. Sexual Practices: Sexually active with female partners, husband has undergone a vasectomy.  GYNECOLOGICAL HISTORY: - Frequency and Flow: Heavy periods occurring once or twice a year  PAST SURGICAL HISTORY: - Obstetrical labial laceration in 2016 -  Diverticulitis in 2022 - Polycystic ovarian syndrome (PCOS) diagnosis in 2020 following the birth of her daughter  FAMILY HISTORY The patient's mother is alive and has lupus, fibromyalgia, diabetes, and clotting issues. The patient's father is alive and has heart disease, specifically cardiomyopathy with an enlarged heart.   Current Outpatient Medications  Medication Instructions   amoxicillin-pot clavulanate (AUGMENTIN) 875-125 mg per tablet 1 tablet, oral, 2 times daily   hydrOXYzine  (ATARAX ) 25 mg, oral, 3 times daily PRN   metoclopramide  (REGLAN ) 5 mg, oral, 4 times daily   predniSONE  (DELTASONE ) 40 mg, oral, Daily   traMADoL (ULTRAM) 50 mg, oral, Every 6 hours PRN    PAST MEDICAL, SOCIAL & FAMILY HISTORY:   Medical History[1] Surgical History[2] Family History[3] Social History[4]   Allergies: Fentanyl  and Morphine   IMMUNIZATIONS/ HEALTH STATUS    Immunization History  Administered Date(s) Administered   Influenza, Live, Intranasal, Quadrivalent 10/06/2012   Influenza,split virus, trivalent, PF 09/19/2024   TDAP VACCINE (BOOSTRIX,ADACEL) 7Y+ 03/22/2019, 12/16/2021     Health Maintenance Status       Date Due Completion Dates   Comprehensive Annual Visit Never done ---   Varicella Vaccines (1 of 2 - 13+ 2-dose series) Never done ---   HIV Screening Never done ---   Hepatitis C Screening Never done ---   Diabetes Screening Never done ---   Pneumococcal Vaccine: Pediatrics (0 to 5 years) and At-Risk Patients (6-49 Years) (1 of 2 - PCV) Never done ---   Hepatitis B Vaccines (1 of 3 - 19+ 3-dose series) Never done ---   Hepatitis A  Vaccines (1 of 2 - Risk 2-dose series) Never done ---   PAP SMEAR Never done ---   COVID-19 Vaccine (1 - 2025-26 season) Never done ---   Depression Monitoring 03/19/2025 09/19/2024   DTaP/Tdap/Td Vaccines (3 - Td or Tdap) 12/17/2031 12/16/2021, 03/22/2019   Adult RSV (50+ Years or Pregnancy) (1 - 1-dose 75+ series) 08/29/2072 ---       Most recent PHQ-2 results: Patient Health Questionnaire-2 Score: 0 (09/19/2024  9:09 AM)  Most recent PHQ-9 result:   PHQ-9 Question # 9   Interpretation: PHQ-2 Interpretation: Negative (None-minimal Depression Severity) (09/19/2024  9:09 AM)    Depression Plan: Normal/Negative Screening   ROS  Other systems reviewed and negative. No other complaints.  Objective   Vitals:   09/19/24 0911  BP: 127/87  Pulse: 69  SpO2: 99%    Physical Exam: Gen: appears uncomfortable in seated position HEENT: PERRLA, neck supple, no lymphadenopathy noted. Cardio: Normal rate, regular rhythm. Normal S1/S2. No appreciable murmurs. Resp: Lungs clear to auscultation bilaterally.  MSK:  Bilateral lower back tender during palpation GI: Abdomen soft, LLQ pain noted during deep palpation Skin: Warm and dry.  Neurologic:  No focal deficits. Psych: Answers questions appropriately, cooperative. Appropriate affect.  ASSESSMENT & PLAN   Problem List Items Addressed This Visit       Digestive   Hemorrhoids     Other   RESOLVED: Abdominal pain   Relevant Medications   traMADoL (ULTRAM) 50 mg tablet   Tobacco use   Other Visit Diagnoses       Encounter to establish care    -  Primary     Anxiety         Diverticulitis         Need for influenza vaccination       Relevant Orders   Flu,Trivalent,IM, Preservative Free (Completed)      Orders Placed This Encounter  Procedures   Flu,Trivalent,IM, Preservative Free   Orders Placed This Encounter  Medications   traMADoL (ULTRAM) 50 mg tablet    Sig: Take 1 tablet (50 mg total) by mouth every 6 (six) hours as needed for moderate pain (4-6).    Dispense:  20 tablet    Refill:  0   Assessment & Plan 1. Encounter to establish care (Primary) - Reviewed medical, family history, surgeries, allergies, medications.  2. Left lower quadrant abdominal pain - Complaints of continued LLQ abdominal pain which radiates to lower back. Denies any  radiation down buttocks or leg. Denies difficulty ambulating. Difficult to sit in chair for extended periods.  - The etiology of the pain remains uncertain, with potential causes including gastrointestinal, gynecological, or musculoskeletal origins. - CT scan has been ordered from UC, scheduled for later this month to r/o diverticulitis/diverticulosis as patient states history of. Was given antibiotics for this at [facility]. She has not picked up prescriptions from pharmacy. - Referrals in place from UC to Gastroenterology and OB-GYN to further investigate cause for continued abdominal pain. - Advised to keep all scheduled f/u appointments with speciality providers. Reviewed red flags/return precautions.  - A trial of Ultram 50 mg every 6 hours as needed for moderate pain will be provided. She has been advised to alternate this with ibuprofen  and Tylenol . She has been instructed to report any adverse reactions to Ultram, such as rash, swelling, or difficulty breathing, and to discontinue use if these occur. - Red flags discussed for adverse reactions to Ultram and she should report to ED for  treatment if they occur: facial swelling, trouble swallowing, trouble breathing. Patient verbalized understanding. - Work note provided today.  3. Anxiety - Currently controlled with hydroxyzine  - Continue with current medication - Will follow up as needed - Ambulatory referral to [facility]  4. Hemorrhoids, unspecified hemorrhoid type - Denies bleeding today. States hemorrhoidal bleeding ceased after given decadron and Toradol  injections at [facility]. - Advised patient to avoid constipation, straining, etc. Advised diet high in fiber/supplements, foods high in fiber such as apples, prunes, whole grains, advised to push fluids and use of daily Miralax to prevent constipation as well as Colace stool softeners. Patient verbalized understanding. - Ambulatory referral to [facility]  5. Diverticulitis - Patient was  established with  Gastroenterology, requested referral to [facility] for further management and treatment. Referral was placed by UC for follow. - Ambulatory referral to [facility]  6. Need for influenza vaccination - Advised flu vaccine due to working at [employer] as CNA. Patient agreed. - Flu,Trivalent,IM, Preservative Free  7. Tobacco use - patient is a former smoker of cigarettes, current smoker of Black & Mild cigars - Advised patient to quit as this is the single, most important thing she can do for her overall health. - Patient verbalized understanding and will reach out if she needs assistance with cessation - Will continue to follow.   Follow up 6 weeks for CPE, sooner if needed. Patient verbalized agreement to above plan.  45 minutes was spent reviewing and interpreting prior notes/images/labs, counseling the patient, transmitting prescriptions and arranging follow up.   This note was dictated with voice recognition software. Similar sounding words may be inadvertently transcribed incorrectly. Note partially generated using DAX software. Pt consent obtained prior to use.    Mliss Jenkins Minors, NP        [1] Past Medical History: Diagnosis Date   Acute pain of left wrist 04/06/2024   Acute pain of right knee 04/06/2024   ADHD (attention deficit hyperactivity disorder)    Anemia    Anxiety    Cannabis abuse 03/25/2014   Provisional diagnosis     Clotting disorder (CMD)    Diarrhea 02/22/2024   Enlarged liver    Gestational diabetes (CMD) 03/07/2022   Influenza 02/22/2024   MDD (major depressive disorder)    Obstetric labial laceration, delivered, current hospitalization (CMD) 06/02/2019   Painful urination 12/28/2023   Strain of lumbar region 12/07/2023   Vaginal irritation 12/28/2023  [2] Past Surgical History: Procedure Laterality Date   BREAST SURGERY     biopsy 2010  [3] Family History Problem Relation Name  Age of Onset   Lupus Mother     Fibromyalgia Mother     Diabetes Mother     Clotting disorder Mother     Heart disease Father         enlarged heart   Heart failure Paternal Grandmother    [4] Social History Tobacco Use   Smoking status: Some Days    Types: Cigarettes, Cigars   Smokeless tobacco: Never  Vaping Use   Vaping status: Never Used  Substance Use Topics   Alcohol use: Not Currently    Comment: socially   Drug use: Never  "

## 2024-09-23 ENCOUNTER — Encounter (HOSPITAL_COMMUNITY): Payer: Self-pay | Admitting: *Deleted

## 2024-09-23 ENCOUNTER — Emergency Department (HOSPITAL_COMMUNITY): Payer: MEDICAID

## 2024-09-23 ENCOUNTER — Other Ambulatory Visit: Payer: Self-pay

## 2024-09-23 ENCOUNTER — Emergency Department (HOSPITAL_COMMUNITY)
Admission: EM | Admit: 2024-09-23 | Discharge: 2024-09-24 | Disposition: A | Payer: MEDICAID | Attending: Emergency Medicine | Admitting: Emergency Medicine

## 2024-09-23 DIAGNOSIS — R1085 Abdominal pain of multiple sites: Secondary | ICD-10-CM

## 2024-09-23 DIAGNOSIS — R1012 Left upper quadrant pain: Secondary | ICD-10-CM | POA: Insufficient documentation

## 2024-09-23 DIAGNOSIS — D72829 Elevated white blood cell count, unspecified: Secondary | ICD-10-CM | POA: Diagnosis not present

## 2024-09-23 DIAGNOSIS — R1032 Left lower quadrant pain: Secondary | ICD-10-CM | POA: Diagnosis present

## 2024-09-23 LAB — CBC
HCT: 40 % (ref 36.0–46.0)
Hemoglobin: 13 g/dL (ref 12.0–15.0)
MCH: 31.9 pg (ref 26.0–34.0)
MCHC: 32.5 g/dL (ref 30.0–36.0)
MCV: 98 fL (ref 80.0–100.0)
Platelets: 223 K/uL (ref 150–400)
RBC: 4.08 MIL/uL (ref 3.87–5.11)
RDW: 14.6 % (ref 11.5–15.5)
WBC: 12.4 K/uL — ABNORMAL HIGH (ref 4.0–10.5)
nRBC: 0 % (ref 0.0–0.2)

## 2024-09-23 LAB — COMPREHENSIVE METABOLIC PANEL WITH GFR
ALT: 32 U/L (ref 0–44)
AST: 21 U/L (ref 15–41)
Albumin: 4.1 g/dL (ref 3.5–5.0)
Alkaline Phosphatase: 56 U/L (ref 38–126)
Anion gap: 9 (ref 5–15)
BUN: 11 mg/dL (ref 6–20)
CO2: 27 mmol/L (ref 22–32)
Calcium: 9.1 mg/dL (ref 8.9–10.3)
Chloride: 101 mmol/L (ref 98–111)
Creatinine, Ser: 0.78 mg/dL (ref 0.44–1.00)
GFR, Estimated: >60 mL/min
Glucose, Bld: 104 mg/dL — ABNORMAL HIGH (ref 70–99)
Potassium: 4 mmol/L (ref 3.5–5.1)
Sodium: 137 mmol/L (ref 135–145)
Total Bilirubin: <0.2 mg/dL (ref 0.0–1.2)
Total Protein: 6.7 g/dL (ref 6.5–8.1)

## 2024-09-23 LAB — URINALYSIS, ROUTINE W REFLEX MICROSCOPIC
Bilirubin Urine: NEGATIVE
Glucose, UA: NEGATIVE mg/dL
Hgb urine dipstick: NEGATIVE
Ketones, ur: NEGATIVE mg/dL
Leukocytes,Ua: NEGATIVE
Nitrite: NEGATIVE
Protein, ur: NEGATIVE mg/dL
Specific Gravity, Urine: 1.021 (ref 1.005–1.030)
pH: 7 (ref 5.0–8.0)

## 2024-09-23 LAB — LIPASE, BLOOD: Lipase: 16 U/L (ref 11–51)

## 2024-09-23 LAB — HCG, SERUM, QUALITATIVE: Preg, Serum: NEGATIVE

## 2024-09-23 MED ORDER — HYDROMORPHONE HCL 1 MG/ML IJ SOLN
1.0000 mg | Freq: Once | INTRAMUSCULAR | Status: AC
  Administered 2024-09-24: 1 mg via INTRAVENOUS
  Filled 2024-09-23: qty 1

## 2024-09-23 MED ORDER — IOHEXOL 350 MG/ML SOLN
75.0000 mL | Freq: Once | INTRAVENOUS | Status: AC | PRN
  Administered 2024-09-24: 75 mL via INTRAVENOUS

## 2024-09-23 MED ORDER — ONDANSETRON HCL 4 MG/2ML IJ SOLN
4.0000 mg | Freq: Once | INTRAMUSCULAR | Status: AC
  Administered 2024-09-24: 4 mg via INTRAVENOUS
  Filled 2024-09-23: qty 2

## 2024-09-23 NOTE — ED Provider Notes (Signed)
 " [place] EMERGENCY DEPARTMENT AT [facility] Provider Note   CSN: [id number] Arrival date & time: 09/23/24  8082     Patient presents with: Abdominal Pain   Candice Farrell is a 29 y.o. female.  {Add pertinent medical, surgical, social history, OB history to YEP:67052} Patient presents to the emergency department for evaluation of abdominal pain.  Patient hurting for at least a week.  Patient reports that the pain was more focal on the left side when it started but now she is having severe, constant diffuse left-sided pain from the upper abdomen into the lower abdomen and around to the back on the left side.       Prior to Admission medications  Medication Sig Start Date End Date Taking? Authorizing Provider  HYDROcodone -acetaminophen  (NORCO/VICODIN) 5-325 MG tablet Take 1 tablet by mouth every 4 (four) hours as needed. 05/28/24   Raford Lenis, MD  hyoscyamine  (LEVSIN ) 0.125 MG tablet Take 1 tablet (0.125 mg total) by mouth every 6 (six) hours as needed for cramping. Patient not taking: Reported on 06/05/2024 05/29/24   Craig Alan SAUNDERS, PA-C  phentermine  15 MG capsule Take 1 capsule (15 mg total) by mouth every morning. Patient not taking: Reported on 05/29/2024 05/08/24   Paseda, Folashade R, FNP    Allergies: Fentanyl  and Morphine     Review of Systems  Updated Vital Signs BP 122/70 (BP Location: Left Arm)   Pulse 70   Temp 97.6 F (36.4 C)   Resp 16   Ht 4' 11 (1.499 m)   Wt 85.3 kg   LMP 06/27/2024   SpO2 99%   BMI 37.98 kg/m   Physical Exam Vitals and nursing note reviewed.  Constitutional:      General: She is not in acute distress.    Appearance: She is well-developed.  HENT:     Head: Normocephalic and atraumatic.     Mouth/Throat:     Mouth: Mucous membranes are moist.  Eyes:     General: Vision grossly intact. Gaze aligned appropriately.     Extraocular Movements: Extraocular movements intact.     Conjunctiva/sclera: Conjunctivae normal.   Cardiovascular:     Rate and Rhythm: Normal rate and regular rhythm.     Pulses: Normal pulses.     Heart sounds: Normal heart sounds, S1 normal and S2 normal. No murmur heard.    No friction rub. No gallop.  Pulmonary:     Effort: Pulmonary effort is normal. No respiratory distress.     Breath sounds: Normal breath sounds.  Abdominal:     General: Bowel sounds are normal.     Palpations: Abdomen is soft.     Tenderness: There is abdominal tenderness in the left upper quadrant and left lower quadrant. There is no guarding or rebound.     Hernia: No hernia is present.  Musculoskeletal:        General: No swelling.     Cervical back: Full passive range of motion without pain, normal range of motion and neck supple. No spinous process tenderness or muscular tenderness. Normal range of motion.     Right lower leg: No edema.     Left lower leg: No edema.  Skin:    General: Skin is warm and dry.     Capillary Refill: Capillary refill takes less than 2 seconds.     Findings: No ecchymosis, erythema, rash or wound.  Neurological:     General: No focal deficit present.     Mental Status:  She is alert and oriented to person, place, and time.     GCS: GCS eye subscore is 4. GCS verbal subscore is 5. GCS motor subscore is 6.     Cranial Nerves: Cranial nerves 2-12 are intact.     Sensory: Sensation is intact.     Motor: Motor function is intact.     Coordination: Coordination is intact.  Psychiatric:        Attention and Perception: Attention normal.        Mood and Affect: Mood normal.        Speech: Speech normal.        Behavior: Behavior normal.     (all labs ordered are listed, but only abnormal results are displayed) Labs Reviewed  COMPREHENSIVE METABOLIC PANEL WITH GFR - Abnormal; Notable for the following components:      Result Value   Glucose, Bld 104 (*)    All other components within normal limits  CBC - Abnormal; Notable for the following components:   WBC 12.4 (*)     All other components within normal limits  URINALYSIS, ROUTINE W REFLEX MICROSCOPIC - Abnormal; Notable for the following components:   APPearance CLOUDY (*)    All other components within normal limits  LIPASE, BLOOD  HCG, SERUM, QUALITATIVE    EKG: None  Radiology: No results found.  {Document cardiac monitor, telemetry assessment procedure when appropriate:32947} Procedures   Medications Ordered in the ED - No data to display    {Click here for ABCD2, HEART and other calculators REFRESH Note before signing:1}                              Medical Decision Making Amount and/or Complexity of Data Reviewed Radiology: ordered.   ***  {Document critical care time when appropriate  Document review of labs and clinical decision tools ie CHADS2VASC2, etc  Document your independent review of radiology images and any outside records  Document your discussion with family members, caretakers and with consultants  Document social determinants of health affecting pt's care  Document your decision making why or why not admission, treatments were needed:32947:::1}   Final diagnoses:  None    ED Discharge Orders     None        "

## 2024-09-23 NOTE — ED Triage Notes (Addendum)
 The pt has had abdomen  painfor over a  week she has been seen by her doctors and she has a c-t that is a month ago she does not want to wait that long for the c-t   she has had n and v but no diarrhea  lmp sep or October. Correction in the  statement about her c-t scan.  It is ordered but she cannot get it for a month and she does not want to wait  that long

## 2024-09-24 MED ORDER — DICYCLOMINE HCL 20 MG PO TABS: 20.0000 mg | ORAL_TABLET | Freq: Three times a day (TID) | ORAL | 0 refills | Status: AC

## 2024-09-24 MED ORDER — SUCRALFATE 1 G PO TABS: 1.0000 g | ORAL_TABLET | Freq: Three times a day (TID) | ORAL | 0 refills | Status: AC

## 2024-09-24 MED ORDER — PANTOPRAZOLE SODIUM 40 MG PO TBEC: 40.0000 mg | DELAYED_RELEASE_TABLET | Freq: Every day | ORAL | 3 refills | Status: AC

## 2024-09-24 NOTE — ED Notes (Signed)
 Pt remains in imaging.

## 2024-09-27 ENCOUNTER — Telehealth: Payer: Self-pay

## 2024-09-27 NOTE — Transitions of Care (Post Inpatient/ED Visit) (Cosign Needed)
" ° °  09/27/2024  Name: Candice Farrell MRN: [id number] DOB: 1996/07/12  Today's TOC FU Call Status: Today's TOC FU Call Status:: Successful TOC FU Call Completed  Patient's Name and Date of Birth confirmed. DOB, Name  Transition Care Management Follow-up Telephone Call Type of Discharge: Emergency Department Reason for ED Visit: Other: How have you been since you were released from the hospital?: Same Any questions or concerns?: No  Items Reviewed: Did you receive and understand the discharge instructions provided?: Yes Medications obtained,verified, and reconciled?: Yes (Medications Reviewed) Any new allergies since your discharge?: No Dietary orders reviewed?: NA Do you have support at home?: Yes People in Home [RPT]: child(ren), dependent  Medications Reviewed Today: Medications Reviewed Today     Reviewed by Cole Geroge SQUIBB, CMA (Certified Medical Assistant) on 09/27/24 at 1214  Med List Status: <None>   Medication Order Taking? Sig Documenting Provider Last Dose Status Informant  amoxicillin-clavulanate (AUGMENTIN) 875-125 MG tablet [id number] Yes Take 1 tablet by mouth 2 (two) times daily. [provider]  Active   atomoxetine  (STRATTERA ) 25 MG capsule [id number] Yes Take 25 mg by mouth daily. [provider]  Active   dicyclomine  (BENTYL ) 20 MG tablet [id number] Yes Take 1 tablet (20 mg total) by mouth 3 (three) times daily before meals. Haze Lonni PARAS, MD  Active   hydrOXYzine  (ATARAX ) 25 MG tablet [id number] Yes Take 25 mg by mouth. [provider]  Active   pantoprazole  (PROTONIX ) 40 MG tablet [id number] Yes Take 1 tablet (40 mg total) by mouth daily. Haze Lonni PARAS, MD  Active   predniSONE  (DELTASONE ) 20 MG tablet [id number] Yes Take 40 mg by mouth daily. [provider]  Active   sucralfate  (CARAFATE ) 1 g tablet [id number] Yes Take 1 tablet (1 g total) by mouth 4 (four) times daily -  with meals and at bedtime. Haze Lonni PARAS, MD  Active   traMADol DANNY) 50 MG tablet [id number] Yes Take 50 mg by mouth. [provider]  Active   Med List Note Virgia Laymon PARAS, CPhT 03/24/14 2356): No preferred pharmacy             Home Care and Equipment/Supplies: Were Home Health Services Ordered?: No Any new equipment or medical supplies ordered?: No  Functional Questionnaire: Do you need assistance with bathing/showering or dressing?: No Do you need assistance with meal preparation?: No Do you need assistance with eating?: No Do you have difficulty maintaining continence: No Do you need assistance with getting out of bed/getting out of a chair/moving?: No Do you have difficulty managing or taking your medications?: No  Follow up appointments reviewed: PCP Follow-up appointment confirmed?: Yes Date of PCP follow-up appointment?: 10/01/24 Specialist Hospital Follow-up appointment confirmed?: NA Do you need transportation to your follow-up appointment?: No Do you understand care options if your condition(s) worsen?: Yes-patient verbalized understanding  SDOH Interventions Today    Flowsheet Row Most Recent Value  SDOH Interventions   Food Insecurity Interventions Intervention Not Indicated  Housing Interventions Intervention Not Indicated  Transportation Interventions Intervention Not Indicated  Utilities Interventions Intervention Not Indicated    SIGNATURE Geroge Cole, CMA   "

## 2024-10-01 ENCOUNTER — Inpatient Hospital Stay: Payer: Self-pay | Admitting: Nurse Practitioner

## 2024-10-05 ENCOUNTER — Encounter: Payer: Self-pay | Admitting: Nurse Practitioner

## 2024-10-05 ENCOUNTER — Inpatient Hospital Stay: Payer: MEDICAID | Admitting: Nurse Practitioner

## 2024-11-12 ENCOUNTER — Encounter: Payer: MEDICAID | Admitting: Nurse Practitioner
